# Patient Record
Sex: Female | Born: 1985
Health system: Southern US, Community
[De-identification: ages and names within clinical notes are randomized; demographics above are authoritative.]

## PROBLEM LIST (undated history)

## (undated) DIAGNOSIS — E079 Disorder of thyroid, unspecified: Secondary | ICD-10-CM

## (undated) DIAGNOSIS — F419 Anxiety disorder, unspecified: Secondary | ICD-10-CM

## (undated) DIAGNOSIS — T7840XA Allergy, unspecified, initial encounter: Secondary | ICD-10-CM

## (undated) HISTORY — DX: Allergy, unspecified, initial encounter: T78.40XA

## (undated) HISTORY — DX: Anxiety disorder, unspecified: F41.9

## (undated) HISTORY — PX: TUBAL LIGATION: SHX77

---

## 2013-05-14 ENCOUNTER — Emergency Department: Payer: Self-pay | Admitting: Emergency Medicine

## 2013-11-12 ENCOUNTER — Ambulatory Visit: Payer: Self-pay | Admitting: Physician Assistant

## 2013-11-29 DIAGNOSIS — M549 Dorsalgia, unspecified: Secondary | ICD-10-CM | POA: Insufficient documentation

## 2014-12-26 DIAGNOSIS — R5383 Other fatigue: Secondary | ICD-10-CM | POA: Insufficient documentation

## 2014-12-26 DIAGNOSIS — R519 Headache, unspecified: Secondary | ICD-10-CM | POA: Insufficient documentation

## 2015-03-13 DIAGNOSIS — E0590 Thyrotoxicosis, unspecified without thyrotoxic crisis or storm: Secondary | ICD-10-CM | POA: Insufficient documentation

## 2015-03-13 DIAGNOSIS — E559 Vitamin D deficiency, unspecified: Secondary | ICD-10-CM | POA: Insufficient documentation

## 2015-12-09 ENCOUNTER — Ambulatory Visit
Admission: EM | Admit: 2015-12-09 | Discharge: 2015-12-09 | Disposition: A | Discharge status: Home / Self Care | Payer: Self-pay | Attending: Family Medicine | Admitting: Family Medicine

## 2015-12-09 ENCOUNTER — Encounter: Payer: Self-pay | Admitting: *Deleted

## 2015-12-09 DIAGNOSIS — N390 Urinary tract infection, site not specified: Secondary | ICD-10-CM

## 2015-12-09 HISTORY — DX: Disorder of thyroid, unspecified: E07.9

## 2015-12-09 LAB — URINALYSIS COMPLETE WITH MICROSCOPIC (ARMC ONLY)
Bilirubin Urine: NEGATIVE
GLUCOSE, UA: NEGATIVE mg/dL
KETONES UR: NEGATIVE mg/dL
NITRITE: NEGATIVE
Protein, ur: NEGATIVE mg/dL
pH: 6 (ref 5.0–8.0)

## 2015-12-09 MED ORDER — CIPROFLOXACIN HCL 500 MG PO TABS: 500.0000 mg | ORAL_TABLET | Freq: Two times a day (BID) | ORAL | Status: DC

## 2015-12-09 NOTE — ED Provider Notes (Signed)
CSN: [id number]     Arrival date & time 12/09/15  1326 History   First MD Initiated Contact with Patient 12/09/15 1453     Chief Complaint  Patient presents with  . Urinary Tract Infection   (Consider location/radiation/quality/duration/timing/severity/associated sxs/prior Treatment) Patient is a 30 y.o. female presenting with dysuria. The history is provided by the patient.  Dysuria Pain quality:  Burning Pain severity:  Mild Onset quality:  Sudden Duration:  5 days Timing:  Constant Progression:  Worsening Chronicity:  New Recent urinary tract infections: no   Relieved by:  None tried Urinary symptoms: frequent urination   Urinary symptoms: no discolored urine, no foul-smelling urine, no hematuria, no hesitancy and no bladder incontinence   Associated symptoms: no abdominal pain, no fever, no flank pain, no genital lesions, no nausea, no vaginal discharge and no vomiting   Risk factors: no hx of pyelonephritis, no hx of urolithiasis, no kidney transplant, not pregnant, no recurrent urinary tract infections, no renal cysts, no renal disease, not single kidney and no urinary catheter     Past Medical History  Diagnosis Date  . Thyroid disease    Past Surgical History  Procedure Laterality Date  . Tubal ligation     History reviewed. No pertinent family history. Social History  Substance Use Topics  . Smoking status: Never Smoker   . Smokeless tobacco: Never Used  . Alcohol Use: Yes   OB History    No data available     Review of Systems  Constitutional: Negative for fever.  Gastrointestinal: Negative for nausea, vomiting and abdominal pain.  Genitourinary: Positive for dysuria. Negative for flank pain and vaginal discharge.    Allergies  Review of patient's allergies indicates no known allergies.  Home Medications   Prior to Admission medications   Medication Sig Start Date End Date Taking? Authorizing Provider  ciprofloxacin (CIPRO) 500 MG tablet Take 1 tablet  (500 mg total) by mouth every 12 (twelve) hours. 12/09/15   Payton Mccallum, MD   Meds Ordered and Administered this Visit  Medications - No data to display  BP 109/68 mmHg  Pulse 79  Temp(Src) 97.9 F (36.6 C) (Oral)  Resp 18  Ht  (1.6 m)  Wt 174 lb (78.926 kg)  BMI 30.83 kg/m2  SpO2 100%  LMP 11/21/2015 No data found.   Physical Exam  Constitutional: She appears well-developed and well-nourished. No distress.  Abdominal: Soft. Bowel sounds are normal. She exhibits no distension and no mass. There is tenderness (suprapubic;). There is no rebound and no guarding.  Skin: She is not diaphoretic.  Nursing note and vitals reviewed.   ED Course  Procedures (including critical care time)  Labs Review Labs Reviewed  URINALYSIS COMPLETEWITH MICROSCOPIC ([facility] ONLY) - Abnormal; Notable for the following:    Specific Gravity, Urine >1.030 (*)    Hgb urine dipstick TRACE (*)    Leukocytes, UA TRACE (*)    Bacteria, UA RARE (*)    Squamous Epithelial / LPF 6-30 (*)    All other components within normal limits  URINE CULTURE    Imaging Review No results found.   Visual Acuity Review  Right Eye Distance:   Left Eye Distance:   Bilateral Distance:    Right Eye Near:   Left Eye Near:    Bilateral Near:         MDM   1. UTI (lower urinary tract infection)     Discharge Medication List as of 12/09/2015  3:06 PM  START taking these medications   Details  ciprofloxacin (CIPRO) 500 MG tablet Take 1 tablet (500 mg total) by mouth every 12 (twelve) hours., Starting 12/09/2015, Until Discontinued, Normal       1. Lab results and diagnosis reviewed with patient 2. rx as per orders above; reviewed possible side effects, interactions, risks and benefits  3. Recommend supportive treatment with increased water intake, otc analgesics 4. Follow-up prn if symptoms worsen or don't improve    Payton Mccallum, MD 12/09/15 1507

## 2015-12-09 NOTE — ED Notes (Signed)
Patient started having symptoms of pelvic pain and burning on urination 1 week ago. Additional symptom of back pain started 3 days ago. Patient has tried drinking lots of water and juices with no resolution of symptoms.

## 2015-12-11 LAB — URINE CULTURE

## 2015-12-12 ENCOUNTER — Telehealth: Payer: Self-pay | Admitting: Emergency Medicine

## 2015-12-12 NOTE — ED Notes (Signed)
Patient was notified of her urine culture result and was instructed to finish her antibiotic.  Patient states that she is still having some discomfort.  Patient was instructed to follow-up here or with her PCP for further evaluation and for possible recollection of her urine.  Patient verbalized understanding.

## 2016-02-10 ENCOUNTER — Emergency Department
Admission: EM | Admit: 2016-02-10 | Discharge: 2016-02-10 | Disposition: A | Discharge status: Home / Self Care | Payer: Self-pay | Attending: Emergency Medicine | Admitting: Emergency Medicine

## 2016-02-10 DIAGNOSIS — E079 Disorder of thyroid, unspecified: Secondary | ICD-10-CM | POA: Insufficient documentation

## 2016-02-10 DIAGNOSIS — H6983 Other specified disorders of Eustachian tube, bilateral: Secondary | ICD-10-CM | POA: Insufficient documentation

## 2016-02-10 DIAGNOSIS — J0110 Acute frontal sinusitis, unspecified: Secondary | ICD-10-CM | POA: Insufficient documentation

## 2016-02-10 MED ORDER — CETIRIZINE HCL 10 MG PO TABS: 10.0000 mg | ORAL_TABLET | Freq: Every day | ORAL | Status: DC

## 2016-02-10 MED ORDER — FLUTICASONE PROPIONATE 50 MCG/ACT NA SUSP: 1.0000 | Freq: Two times a day (BID) | NASAL | Status: DC

## 2016-02-10 MED ORDER — AMOXICILLIN-POT CLAVULANATE 875-125 MG PO TABS: 1.0000 | ORAL_TABLET | Freq: Two times a day (BID) | ORAL | Status: DC

## 2016-02-10 NOTE — Discharge Instructions (Signed)

## 2016-02-10 NOTE — ED Provider Notes (Signed)
[facility] Emergency Department Provider Note  ____________________________________________  Time seen: Approximately 5:30 PM  I have reviewed the triage vital signs and the nursing notes.   HISTORY  Chief Complaint Otalgia    HPI Alexandria Jackson is a 30 y.o. female who presents emergency department complaining of sinus pressure and right ear pain. Patient states that she was recently flying and developed nasal congestion and ultimately ear pain status post falling. Patient denies any fevers, headaches, visual acuity changes, difficulty breathing or swallowing, chest pain, abdominal pain, nausea or vomiting.   Past Medical History  Diagnosis Date  . Thyroid disease     There are no active problems to display for this patient.   Past Surgical History  Procedure Laterality Date  . Tubal ligation      Current Outpatient Rx  Name  Route  Sig  Dispense  Refill  . amoxicillin-clavulanate (AUGMENTIN) 875-125 MG tablet   Oral   Take 1 tablet by mouth 2 (two) times daily.   14 tablet   0   . cetirizine (ZYRTEC) 10 MG tablet   Oral   Take 1 tablet (10 mg total) by mouth daily.   30 tablet   0   . ciprofloxacin (CIPRO) 500 MG tablet   Oral   Take 1 tablet (500 mg total) by mouth every 12 (twelve) hours.   10 tablet   0   . fluticasone (FLONASE) 50 MCG/ACT nasal spray   Each Nare   Place 1 spray into both nostrils 2 (two) times daily.   16 g   0     Allergies Review of patient's allergies indicates no known allergies.  No family history on file.  Social History Social History  Substance Use Topics  . Smoking status: Never Smoker   . Smokeless tobacco: Never Used  . Alcohol Use: Yes     Review of Systems  Constitutional: No fever/chills Eyes: No visual changes. No discharge ENT: No sore throat.For nasal congestion and sinus pressure. Positive for right ear pain. Cardiovascular: no chest pain. Respiratory: no cough. No  SOB. Skin: Negative for rash. Neurological: Negative for headaches, focal weakness or numbness. 10-point ROS otherwise negative.  ____________________________________________   PHYSICAL EXAM:  VITAL SIGNS: ED Triage Vitals  Enc Vitals Group     BP 02/10/16 1713 130/81 mmHg     Pulse Rate 02/10/16 1713 96     Resp 02/10/16 1713 18     Temp 02/10/16 1713 98.6 F (37 C)     Temp Source 02/10/16 1713 Oral     SpO2 02/10/16 1713 98 %     Weight 02/10/16 1713 168 lb (76.204 kg)     Height 02/10/16 1713  (1.6 m)     Head Cir --      Peak Flow --      Pain Score 02/10/16 1715 9     Pain Loc --      Pain Edu? --      Excl. in GC? --      Constitutional: Alert and oriented. Well appearing and in no acute distress. Eyes: Conjunctivae are normal. PERRL. EOMI. Head: Atraumatic. ENT:      Ears: EACs are unremarkable bilaterally. TMs are moderately bulging or air-fluid level.      Nose: Mild purulent congestion/rhinnorhea. Turbinates are boggy. Patient is tender to percussion over the frontal and maxillary sinuses.      Mouth/Throat: Mucous membranes are moist.  Neck: No stridor.  Hematological/Lymphatic/Immunilogical: No cervical lymphadenopathy. Cardiovascular: Normal rate, regular rhythm. Normal S1 and S2.  Good peripheral circulation. Respiratory: Normal respiratory effort without tachypnea or retractions. Lungs CTAB. Neurologic:  Normal speech and language. No gross focal neurologic deficits are appreciated.  Skin:  Skin is warm, dry and intact. No rash noted. Psychiatric: Mood and affect are normal. Speech and behavior are normal. Patient exhibits appropriate insight and judgement.   ____________________________________________   LABS (all labs ordered are listed, but only abnormal results are displayed)  Labs Reviewed - No data to display ____________________________________________  EKG   ____________________________________________  RADIOLOGY   No  results found.  ____________________________________________    PROCEDURES  Procedure(s) performed:       Medications - No data to display   ____________________________________________   INITIAL IMPRESSION / ASSESSMENT AND PLAN / ED COURSE  Pertinent labs & imaging results that were available during my care of the patient were reviewed by me and considered in my medical decision making (see chart for details).  Patient's diagnosis is consistent with acute sinusitis resulting in eustachian tube dysfunction.. Patient will be discharged home with prescriptions for Flonase, Zyrtec, antibiotics. Patient is to follow up with primary care provider if symptoms persist past this treatment course. Patient is given ED precautions to return to the ED for any worsening or new symptoms.     ____________________________________________  FINAL CLINICAL IMPRESSION(S) / ED DIAGNOSES  Final diagnoses:  Acute frontal sinusitis, recurrence not specified  Eustachian tube dysfunction, bilateral      NEW MEDICATIONS STARTED DURING THIS VISIT:  New Prescriptions   AMOXICILLIN-CLAVULANATE (AUGMENTIN) 875-125 MG TABLET    Take 1 tablet by mouth 2 (two) times daily.   CETIRIZINE (ZYRTEC) 10 MG TABLET    Take 1 tablet (10 mg total) by mouth daily.   FLUTICASONE (FLONASE) 50 MCG/ACT NASAL SPRAY    Place 1 spray into both nostrils 2 (two) times daily.        This chart was dictated using voice recognition software/Dragon. Despite best efforts to proofread, errors can occur which can change the meaning. Any change was purely unintentional.    Racheal PatchesJonathan D Destaney Sarkis, PA-C 02/10/16 1756  Phineas SemenGraydon Goodman, MD 02/10/16 1810

## 2016-02-10 NOTE — ED Notes (Signed)
Developed right ear pain this am   No fever or drainage from ear  Positive sinus pressure

## 2016-02-10 NOTE — ED Notes (Signed)
Pt c/o right ear pain today, states she has had some sinus congestion

## 2016-02-21 ENCOUNTER — Emergency Department
Admission: EM | Admit: 2016-02-21 | Discharge: 2016-02-21 | Disposition: A | Discharge status: Home / Self Care | Payer: BLUE CROSS/BLUE SHIELD | Attending: Emergency Medicine | Admitting: Emergency Medicine

## 2016-02-21 ENCOUNTER — Encounter: Payer: Self-pay | Admitting: Emergency Medicine

## 2016-02-21 DIAGNOSIS — Z7951 Long term (current) use of inhaled steroids: Secondary | ICD-10-CM | POA: Insufficient documentation

## 2016-02-21 DIAGNOSIS — R102 Pelvic and perineal pain: Secondary | ICD-10-CM | POA: Diagnosis present

## 2016-02-21 DIAGNOSIS — E079 Disorder of thyroid, unspecified: Secondary | ICD-10-CM | POA: Diagnosis not present

## 2016-02-21 DIAGNOSIS — Z792 Long term (current) use of antibiotics: Secondary | ICD-10-CM | POA: Insufficient documentation

## 2016-02-21 DIAGNOSIS — N3090 Cystitis, unspecified without hematuria: Secondary | ICD-10-CM | POA: Insufficient documentation

## 2016-02-21 LAB — URINALYSIS COMPLETE WITH MICROSCOPIC (ARMC ONLY)
BILIRUBIN URINE: NEGATIVE
GLUCOSE, UA: NEGATIVE mg/dL
HGB URINE DIPSTICK: NEGATIVE
KETONES UR: NEGATIVE mg/dL
Nitrite: NEGATIVE
PH: 7 (ref 5.0–8.0)
Protein, ur: NEGATIVE mg/dL
Specific Gravity, Urine: 1.024 (ref 1.005–1.030)

## 2016-02-21 LAB — POCT PREGNANCY, URINE: Preg Test, Ur: NEGATIVE

## 2016-02-21 MED ORDER — NITROFURANTOIN MACROCRYSTAL 100 MG PO CAPS: 100.0000 mg | ORAL_CAPSULE | Freq: Two times a day (BID) | ORAL | Status: DC

## 2016-02-21 MED ORDER — NAPROXEN 500 MG PO TABS: 500.0000 mg | ORAL_TABLET | Freq: Two times a day (BID) | ORAL | Status: DC

## 2016-02-21 NOTE — ED Provider Notes (Signed)
[facility] Emergency Department Provider Note  ____________________________________________  Time seen: 4:00 PM  I have reviewed the triage vital signs and the nursing notes.   HISTORY  Chief Complaint Abdominal Cramping    HPI Alexandria Jackson is a 30 y.o. female who complains of intermittent abdominal cramping over the past week associated with abdominal bloating and breast tenderness bilaterally. She took a home pregnancy test which was negative. She has a history of tubal ligation 7 years ago. No vaginal bleeding or discharge. She has some urinary frequency but no dysuria or hematuria. Eating and drinking normally. She was recently treated for sinusitis with Augmentin but she reports she stopped the antibiotic course early before completing it.    Past Medical History  Diagnosis Date  . Thyroid disease      There are no active problems to display for this patient.    Past Surgical History  Procedure Laterality Date  . Tubal ligation       Current Outpatient Rx  Name  Route  Sig  Dispense  Refill  . amoxicillin-clavulanate (AUGMENTIN) 875-125 MG tablet   Oral   Take 1 tablet by mouth 2 (two) times daily.   14 tablet   0   . cetirizine (ZYRTEC) 10 MG tablet   Oral   Take 1 tablet (10 mg total) by mouth daily.   30 tablet   0   . ciprofloxacin (CIPRO) 500 MG tablet   Oral   Take 1 tablet (500 mg total) by mouth every 12 (twelve) hours.   10 tablet   0   . fluticasone (FLONASE) 50 MCG/ACT nasal spray   Each Nare   Place 1 spray into both nostrils 2 (two) times daily.   16 g   0   . naproxen (NAPROSYN) 500 MG tablet   Oral   Take 1 tablet (500 mg total) by mouth 2 (two) times daily with a meal.   20 tablet   0   . nitrofurantoin (MACRODANTIN) 100 MG capsule   Oral   Take 1 capsule (100 mg total) by mouth 2 (two) times daily.   6 capsule   0      Allergies Review of patient's allergies indicates no known  allergies.   No family history on file.  Social History Social History  Substance Use Topics  . Smoking status: Never Smoker   . Smokeless tobacco: Never Used  . Alcohol Use: Yes    Review of Systems  Constitutional:   No fever or chills.  Eyes:   No vision changes.  ENT:   No sore throat. No rhinorrhea. Cardiovascular:   No chest pain. Respiratory:   No dyspnea or cough. Gastrointestinal:   Positive lower abdominal pain with bloating but without vomiting and diarrhea.  No bloody stool. Genitourinary:   Positive urinary frequency. Musculoskeletal:   Negative for focal pain or swelling Neurological:   Negative for headaches 10-point ROS otherwise negative.  ____________________________________________   PHYSICAL EXAM:  VITAL SIGNS: ED Triage Vitals  Enc Vitals Group     BP 02/21/16 1454 140/88 mmHg     Pulse Rate 02/21/16 1454 90     Resp 02/21/16 1454 18     Temp 02/21/16 1454 99 F (37.2 C)     Temp src --      SpO2 02/21/16 1454 96 %     Weight 02/21/16 1454 168 lb (76.204 kg)     Height 02/21/16 1454  (1.6 m)  Head Cir --      Peak Flow --      Pain Score 02/21/16 1456 0     Pain Loc --      Pain Edu? --      Excl. in GC? --     Vital signs reviewed, nursing assessments reviewed.   Constitutional:   Alert and oriented. Well appearing and in no distress. Eyes:   No scleral icterus. No conjunctival pallor. PERRL. EOMI ENT   Head:   Normocephalic and atraumatic.   Nose:   No congestion/rhinnorhea. No septal hematoma   Mouth/Throat:   MMM, no pharyngeal erythema. No peritonsillar mass.    Neck:   No stridor. No SubQ emphysema. No meningismus. Hematological/Lymphatic/Immunilogical:   No cervical lymphadenopathy. Cardiovascular:   RRR. Symmetric bilateral radial and DP pulses.  No murmurs.  Respiratory:   Normal respiratory effort without tachypnea nor retractions. Breath sounds are clear and equal bilaterally. No  wheezes/rales/rhonchi. Gastrointestinal:   Soft with mild suprapubic tenderness. Non distended. There is no CVA tenderness.  No rebound, rigidity, or guarding. Genitourinary:   deferred Musculoskeletal:   Nontender with normal range of motion in all extremities. No joint effusions.  No lower extremity tenderness.  No edema. Neurologic:   Normal speech and language.  CN 2-10 normal. Motor grossly intact. No gross focal neurologic deficits are appreciated.  Skin:    Skin is warm, dry and intact. No rash noted.  No petechiae, purpura, or bullae.  ____________________________________________    LABS (pertinent positives/negatives) (all labs ordered are listed, but only abnormal results are displayed) Labs Reviewed  URINALYSIS COMPLETEWITH MICROSCOPIC ([facility] ONLY) - Abnormal; Notable for the following:    Color, Urine YELLOW (*)    APPearance HAZY (*)    Leukocytes, UA 1+ (*)    Bacteria, UA RARE (*)    Squamous Epithelial / LPF 6-30 (*)    All other components within normal limits  POC URINE PREG, ED  POCT PREGNANCY, URINE   ____________________________________________   EKG    ____________________________________________    RADIOLOGY    ____________________________________________   PROCEDURES   ____________________________________________   INITIAL IMPRESSION / ASSESSMENT AND PLAN / ED COURSE  Pertinent labs & imaging results that were available during my care of the patient were reviewed by me and considered in my medical decision making (see chart for details).  Patient presents with suprapubic pain and tenderness, urinary frequency, consistent with cystitis. She is also having an irregular period as it's been about 6 weeks since her last menstrual cycle. Pregnancy test is negative. She is well-appearing no acute distress, energetic and smiling pleasantly interactive. Counseled to take NSAIDs for now, Macrobid for cystitis, follow-up with primary care. Possibly  has an ovarian cyst but her presentation is not consistent with torsion at this time. Low Suspicion for STI or PID.     ____________________________________________   FINAL CLINICAL IMPRESSION(S) / ED DIAGNOSES  Final diagnoses:  Pelvic pain in female  Cystitis       Portions of this note were generated with dragon dictation software. Dictation errors may occur despite best attempts at proofreading.   Sharman CheekPhillip Lanorris Kalisz, MD 02/21/16 [phone]

## 2016-02-21 NOTE — ED Notes (Signed)
LMP 01/06/16.  Patient has been having lower abdominal cramping and breast tenderness, has not had menstruation this month.  Patient had tubal ligation 7 years ago.  Home pregnancy test negative.

## 2016-02-21 NOTE — ED Notes (Signed)
Lower abdominal pain intermittently. Bilateral breast tenderness intermittently. Pt no menstrual cycle this month Pt alert and oriented X4, active, cooperative, pt in NAD. RR even and unlabored, color WNL.

## 2016-02-21 NOTE — ED Notes (Signed)
Pt alert and oriented X4, active, cooperative, pt in NAD. RR even and unlabored, color WNL.  Pt informed to return if any life threatening symptoms occur.   

## 2016-02-21 NOTE — Discharge Instructions (Signed)
Abdominal Pain, Adult °Many things can cause abdominal pain. Usually, abdominal pain is not caused by a disease and will improve without treatment. It can often be observed and treated at home. Your health care provider will do a physical exam and possibly order blood tests and X-rays to help determine the seriousness of your pain. However, in many cases, more time must pass before a clear cause of the pain can be found. Before that point, your health care provider may not know if you need more testing or further treatment. °HOME CARE INSTRUCTIONS °Monitor your abdominal pain for any changes. The following actions may help to alleviate any discomfort you are experiencing: °· Only take over-the-counter or prescription medicines as directed by your health care provider. °· Do not take laxatives unless directed to do so by your health care provider. °· Try a clear liquid diet (broth, tea, or water) as directed by your health care provider. Slowly move to a bland diet as tolerated. °SEEK MEDICAL CARE IF: °· You have unexplained abdominal pain. °· You have abdominal pain associated with nausea or diarrhea. °· You have pain when you urinate or have a bowel movement. °· You experience abdominal pain that wakes you in the night. °· You have abdominal pain that is worsened or improved by eating food. °· You have abdominal pain that is worsened with eating fatty foods. °· You have a fever. °SEEK IMMEDIATE MEDICAL CARE IF: °· Your pain does not go away within 2 hours. °· You keep throwing up (vomiting). °· Your pain is felt only in portions of the abdomen, such as the right side or the left lower portion of the abdomen. °· You pass bloody or black tarry stools. °MAKE SURE YOU: °· Understand these instructions. °· Will watch your condition. °· Will get help right away if you are not doing well or get worse. °  °This information is not intended to replace advice given to you by your health care provider. Make sure you discuss  any questions you have with your health care provider. °  °Document Released: 07/29/2005 Document Revised: 07/10/2015 Document Reviewed: 06/28/2013 °Elsevier Interactive Patient Education ©2016 Elsevier Inc. ° °Pelvic Pain, Female °Female pelvic pain can be caused by many different things and start from a variety of places. Pelvic pain refers to pain that is located in the lower half of the abdomen and between your hips. The pain may occur over a short period of time (acute) or may be reoccurring (chronic). The cause of pelvic pain may be related to disorders affecting the female reproductive organs (gynecologic), but it may also be related to the bladder, kidney stones, an intestinal complication, or muscle or skeletal problems. Getting help right away for pelvic pain is important, especially if there has been severe, sharp, or a sudden onset of unusual pain. It is also important to get help right away because some types of pelvic pain can be life threatening.  °CAUSES  °Below are only some of the causes of pelvic pain. The causes of pelvic pain can be in one of several categories.  °· Gynecologic. °· Pelvic inflammatory disease. °· Sexually transmitted infection. °· Ovarian cyst or a twisted ovarian ligament (ovarian torsion). °· Uterine lining that grows outside the uterus (endometriosis). °· Fibroids, cysts, or tumors. °· Ovulation. °· Pregnancy. °· Pregnancy that occurs outside the uterus (ectopic pregnancy). °· Miscarriage. °· Labor. °· Abruption of the placenta or ruptured uterus. °· Infection. °· Uterine infection (endometritis). °· Bladder infection. °· Diverticulitis. °·   Miscarriage related to a uterine infection (septic abortion). °· Bladder. °· Inflammation of the bladder (cystitis). °· Kidney stone(s). °· Gastrointestinal. °· Constipation. °· Diverticulitis. °· Neurologic. °· Trauma. °· Feeling pelvic pain because of mental or emotional causes (psychosomatic). °· Cancers of the bowel or  pelvis. °EVALUATION  °Your caregiver will want to take a careful history of your concerns. This includes recent changes in your health, a careful gynecologic history of your periods (menses), and a sexual history. Obtaining your family history and medical history is also important. Your caregiver may suggest a pelvic exam. A pelvic exam will help identify the location and severity of the pain. It also helps in the evaluation of which organ system may be involved. In order to identify the cause of the pelvic pain and be properly treated, your caregiver may order tests. These tests may include:  °· A pregnancy test. °· Pelvic ultrasonography. °· An X-ray exam of the abdomen. °· A urinalysis or evaluation of vaginal discharge. °· Blood tests. °HOME CARE INSTRUCTIONS  °· Only take over-the-counter or prescription medicines for pain, discomfort, or fever as directed by your caregiver.   °· Rest as directed by your caregiver.   °· Eat a balanced diet.   °· Drink enough fluids to make your urine clear or pale yellow, or as directed.   °· Avoid sexual intercourse if it causes pain.   °· Apply warm or cold compresses to the lower abdomen depending on which one helps the pain.   °· Avoid stressful situations.   °· Keep a journal of your pelvic pain. Write down when it started, where the pain is located, and if there are things that seem to be associated with the pain, such as food or your menstrual cycle. °· Follow up with your caregiver as directed.   °SEEK MEDICAL CARE IF: °· Your medicine does not help your pain. °· You have abnormal vaginal discharge. °SEEK IMMEDIATE MEDICAL CARE IF:  °· You have heavy bleeding from the vagina.   °· Your pelvic pain increases.   °· You feel light-headed or faint.   °· You have chills.   °· You have pain with urination or blood in your urine.   °· You have uncontrolled diarrhea or vomiting.   °· You have a fever or persistent symptoms for more than 3 days. °· You have a fever and your  symptoms suddenly get worse.   °· You are being physically or sexually abused. °  °This information is not intended to replace advice given to you by your health care provider. Make sure you discuss any questions you have with your health care provider. °  °Document Released: 09/15/2004 Document Revised: 07/10/2015 Document Reviewed: 02/08/2012 °Elsevier Interactive Patient Education ©2016 Elsevier Inc. ° °Urinary Tract Infection °Urinary tract infections (UTIs) can develop anywhere along your urinary tract. Your urinary tract is your body's drainage system for removing wastes and extra water. Your urinary tract includes two kidneys, two ureters, a bladder, and a urethra. Your kidneys are a pair of bean-shaped organs. Each kidney is about the size of your fist. They are located below your ribs, one on each side of your spine. °CAUSES °Infections are caused by microbes, which are microscopic organisms, including fungi, viruses, and bacteria. These organisms are so small that they can only be seen through a microscope. Bacteria are the microbes that most commonly cause UTIs. °SYMPTOMS  °Symptoms of UTIs may vary by age and gender of the patient and by the location of the infection. Symptoms in young women typically include a frequent and intense urge   to urinate and a painful, burning feeling in the bladder or urethra during urination. Older women and men are more likely to be tired, shaky, and weak and have muscle aches and abdominal pain. A fever may mean the infection is in your kidneys. Other symptoms of a kidney infection include pain in your back or sides below the ribs, nausea, and vomiting. °DIAGNOSIS °To diagnose a UTI, your caregiver will ask you about your symptoms. Your caregiver will also ask you to provide a urine sample. The urine sample will be tested for bacteria and white blood cells. White blood cells are made by your body to help fight infection. °TREATMENT  °Typically, UTIs can be treated with  medication. Because most UTIs are caused by a bacterial infection, they usually can be treated with the use of antibiotics. The choice of antibiotic and length of treatment depend on your symptoms and the type of bacteria causing your infection. °HOME CARE INSTRUCTIONS °· If you were prescribed antibiotics, take them exactly as your caregiver instructs you. Finish the medication even if you feel better after you have only taken some of the medication. °· Drink enough water and fluids to keep your urine clear or pale yellow. °· Avoid caffeine, tea, and carbonated beverages. They tend to irritate your bladder. °· Empty your bladder often. Avoid holding urine for long periods of time. °· Empty your bladder before and after sexual intercourse. °· After a bowel movement, women should cleanse from front to back. Use each tissue only once. °SEEK MEDICAL CARE IF:  °· You have back pain. °· You develop a fever. °· Your symptoms do not begin to resolve within 3 days. °SEEK IMMEDIATE MEDICAL CARE IF:  °· You have severe back pain or lower abdominal pain. °· You develop chills. °· You have nausea or vomiting. °· You have continued burning or discomfort with urination. °MAKE SURE YOU:  °· Understand these instructions. °· Will watch your condition. °· Will get help right away if you are not doing well or get worse. °  °This information is not intended to replace advice given to you by your health care provider. Make sure you discuss any questions you have with your health care provider. °  °Document Released: 07/29/2005 Document Revised: 07/10/2015 Document Reviewed: 11/27/2011 °Elsevier Interactive Patient Education ©2016 Elsevier Inc. ° °

## 2016-04-22 ENCOUNTER — Encounter: Payer: Self-pay | Admitting: Emergency Medicine

## 2016-04-22 ENCOUNTER — Emergency Department
Admission: EM | Admit: 2016-04-22 | Discharge: 2016-04-22 | Disposition: A | Discharge status: Home / Self Care | Payer: BLUE CROSS/BLUE SHIELD | Attending: Emergency Medicine | Admitting: Emergency Medicine

## 2016-04-22 DIAGNOSIS — M273 Alveolitis of jaws: Secondary | ICD-10-CM | POA: Diagnosis not present

## 2016-04-22 DIAGNOSIS — K0889 Other specified disorders of teeth and supporting structures: Secondary | ICD-10-CM | POA: Diagnosis present

## 2016-04-22 MED ORDER — LIDOCAINE VISCOUS 2 % MT SOLN
15.0000 mL | Freq: Once | OROMUCOSAL | Status: AC
  Administered 2016-04-22: 15 mL via OROMUCOSAL
  Filled 2016-04-22: qty 15

## 2016-04-22 MED ORDER — AMOXICILLIN 500 MG PO CAPS: 500.0000 mg | ORAL_CAPSULE | Freq: Three times a day (TID) | ORAL | Status: DC

## 2016-04-22 MED ORDER — LIDOCAINE VISCOUS 2 % MT SOLN: 15.0000 mL | Freq: Once | OROMUCOSAL | Status: DC

## 2016-04-22 MED ORDER — LIDOCAINE VISCOUS 2 % MT SOLN: 5.0000 mL | Freq: Four times a day (QID) | OROMUCOSAL | Status: DC | PRN

## 2016-04-22 MED ORDER — TRAMADOL HCL 50 MG PO TABS: 50.0000 mg | ORAL_TABLET | Freq: Four times a day (QID) | ORAL | Status: AC | PRN

## 2016-04-22 NOTE — Discharge Instructions (Signed)
Dental Dry Socket A dental dry socket can happen after a tooth is pulled. When a tooth gets pulled, it leaves a hole (socket). Normally, blood fills up the hole and hardens (clots). A dry socket happens if blood gets removed from the hole or does not fill the hole.  HOME CARE  Follow your dentist's instructions.  Only take medicines as told by your dentist.  Take your medicine (antibiotics) as told if you are given medicine. Finish them even if you start to feel better.  Wait 1 day to rinse your mouth with warm salt water. Spit water out very gently.  Avoid bubbly (carbonated) drinks.  Avoid alcohol.  Avoid smoking. GET HELP RIGHT AWAY IF:  Your medicine does not help your pain.  You have puffiness (swelling), severe pain, or you cannot stop bleeding.  You have a temperature by mouth above 102 F (38.9 C), not controlled by medicine.  You have trouble swallowing or cannot open your mouth.  You have severe problems (symptoms). MAKE SURE YOU:  Understand these instructions.  Will watch your condition.  Will get help right away if you are not doing well or get worse.   This information is not intended to replace advice given to you by your health care provider. Make sure you discuss any questions you have with your health care provider.   Document Released: 10/19/2005 Document Revised: 11/09/2014 Document Reviewed: 06/03/2015 Elsevier Interactive Patient Education [employer]2016 Elsevier Inc.

## 2016-04-22 NOTE — ED Notes (Signed)
NAD noted at time of D/C. Pt denies questions or concerns. Pt ambulatory to the lobby at this time.  

## 2016-04-22 NOTE — ED Provider Notes (Signed)
[facility] Emergency Department Provider Note   ____________________________________________  Time seen: Approximately 6:31 PM  I have reviewed the triage vital signs and the nursing notes.   HISTORY  Chief Complaint Dental Pain    HPI Alexandria Jackson is a 30 y.o. female patient complaining of dental pain left lower molar. Patient states she had 4 wisdom teeth pulled out a week ago. Patient right side is doing fine but the bottom left is painful and she believes infected. Patient states she is contacted a dentist but cannot see her until the 26th of this month. Patient states she's taking ibuprofen with no relief. She states she is able to tolerate food and fluids. Patient denies any fever with this complaint. Patient state the gingiva area is edematous. Patient is rating the pain as 8/10. No other palliative measures for this complaint.   Past Medical History  Diagnosis Date  . Thyroid disease     There are no active problems to display for this patient.   Past Surgical History  Procedure Laterality Date  . Tubal ligation      Current Outpatient Rx  Name  Route  Sig  Dispense  Refill  . amoxicillin (AMOXIL) 500 MG capsule   Oral   Take 1 capsule (500 mg total) by mouth 3 (three) times daily.   30 capsule   0   . amoxicillin-clavulanate (AUGMENTIN) 875-125 MG tablet   Oral   Take 1 tablet by mouth 2 (two) times daily.   14 tablet   0   . cetirizine (ZYRTEC) 10 MG tablet   Oral   Take 1 tablet (10 mg total) by mouth daily.   30 tablet   0   . ciprofloxacin (CIPRO) 500 MG tablet   Oral   Take 1 tablet (500 mg total) by mouth every 12 (twelve) hours.   10 tablet   0   . fluticasone (FLONASE) 50 MCG/ACT nasal spray   Each Nare   Place 1 spray into both nostrils 2 (two) times daily.   16 g   0   . lidocaine (XYLOCAINE) 2 % solution   Mouth/Throat   Use as directed 5 mLs in the mouth or throat every 6 (six) hours as needed for  mouth pain.   100 mL   0   . naproxen (NAPROSYN) 500 MG tablet   Oral   Take 1 tablet (500 mg total) by mouth 2 (two) times daily with a meal.   20 tablet   0   . nitrofurantoin (MACRODANTIN) 100 MG capsule   Oral   Take 1 capsule (100 mg total) by mouth 2 (two) times daily.   6 capsule   0   . traMADol (ULTRAM) 50 MG tablet   Oral   Take 1 tablet (50 mg total) by mouth every 6 (six) hours as needed.   20 tablet   0     Allergies Review of patient's allergies indicates no known allergies.  No family history on file.  Social History Social History  Substance Use Topics  . Smoking status: Never Smoker   . Smokeless tobacco: Never Used  . Alcohol Use: Yes    Review of Systems Constitutional: No fever/chills Eyes: No visual changes. ENT: No sore throat.Dental pain Cardiovascular: Denies chest pain. Respiratory: Denies shortness of breath. Gastrointestinal: No abdominal pain.  No nausea, no vomiting.  No diarrhea.  No constipation. Genitourinary: Negative for dysuria. Musculoskeletal: Negative for back pain. Skin: Negative for  rash. Neurological: Negative for headaches, focal weakness or numbness. Endocrine:Hypothyroidism ____________________________________________   PHYSICAL EXAM:  VITAL SIGNS: ED Triage Vitals  Enc Vitals Group     BP 04/22/16 1829 136/89 mmHg     Pulse Rate 04/22/16 1829 100     Resp 04/22/16 1829 18     Temp 04/22/16 1829 98.7 F (37.1 C)     Temp Source 04/22/16 1829 Oral     SpO2 04/22/16 1829 98 %     Weight 04/22/16 1829 181 lb (82.101 kg)     Height 04/22/16 1829 5\' 3"  (1.6 m)     Head Cir --      Peak Flow --      Pain Score 04/22/16 1826 8     Pain Loc --      Pain Edu? --      Excl. in GC? --     Constitutional: Alert and oriented. Well appearing and in no acute distress. Eyes: Conjunctivae are normal. PERRL. EOMI. Head: Atraumatic. Nose: No congestion/rhinnorhea. Mouth/Throat: Mucous membranes are moist.   Oropharynx non-erythematous.Edematous gingiva tooth #17. Neck: No stridor.  No cervical spine tenderness to palpation. Hematological/Lymphatic/Immunilogical: No cervical lymphadenopathy. Cardiovascular: Normal rate, regular rhythm. Grossly normal heart sounds.  Good peripheral circulation. Respiratory: Normal respiratory effort.  No retractions. Lungs CTAB. Gastrointestinal: Soft and nontender. No distention. No abdominal bruits. No CVA tenderness. Musculoskeletal: No lower extremity tenderness nor edema.  No joint effusions. Neurologic:  Normal speech and language. No gross focal neurologic deficits are appreciated. No gait instability. Skin:  Skin is warm, dry and intact. No rash noted. Psychiatric: Mood and affect are normal. Speech and behavior are normal.  ____________________________________________   LABS (all labs ordered are listed, but only abnormal results are displayed)  Labs Reviewed - No data to display ____________________________________________  EKG   ____________________________________________  RADIOLOGY   ____________________________________________   PROCEDURES  Procedure(s) performed: None  Critical Care performed: No  ____________________________________________   INITIAL IMPRESSION / ASSESSMENT AND PLAN / ED COURSE  Pertinent labs & imaging results that were available during my care of the patient were reviewed by me and considered in my medical decision making (see chart for details).  Dry socket tooth #17. Patient given discharge Instructions. Patient advised to follow-up with scheduled dental appointment next week. Patient given prescription for tramadol, viscous lidocaine, and Amoxil. ____________________________________________   FINAL CLINICAL IMPRESSION(S) / ED DIAGNOSES  Final diagnoses:  Dry tooth socket      NEW MEDICATIONS STARTED DURING THIS VISIT:  New Prescriptions   AMOXICILLIN (AMOXIL) 500 MG CAPSULE    Take 1 capsule  (500 mg total) by mouth 3 (three) times daily.   LIDOCAINE (XYLOCAINE) 2 % SOLUTION    Use as directed 5 mLs in the mouth or throat every 6 (six) hours as needed for mouth pain.   TRAMADOL (ULTRAM) 50 MG TABLET    Take 1 tablet (50 mg total) by mouth every 6 (six) hours as needed.     Note:  This document was prepared using Dragon voice recognition software and may include unintentional dictation errors.    Joni Reiningonald K Ruari Duggan, PA-C 04/22/16 1851  Phineas SemenGraydon Goodman, MD 04/22/16 [phone]

## 2016-04-22 NOTE — ED Notes (Signed)
Patient presents to the ED with dental pain to the left side of her mouth. Patient states her wisdom teeth were pulled about 1 week ago and the right side seems fine but the left side feels, "infected and painful".  Patient reports pain radiating into throat and face. Patient is in no obvious distress at this time.  Skin is warm and dry.  Patient is maintaining secretions and patient is speaking in full sentences.

## 2016-10-08 DIAGNOSIS — L700 Acne vulgaris: Secondary | ICD-10-CM | POA: Insufficient documentation

## 2016-10-08 DIAGNOSIS — F418 Other specified anxiety disorders: Secondary | ICD-10-CM | POA: Insufficient documentation

## 2017-04-03 ENCOUNTER — Encounter: Payer: Self-pay | Admitting: Medical Oncology

## 2017-04-03 ENCOUNTER — Emergency Department
Admission: EM | Admit: 2017-04-03 | Discharge: 2017-04-03 | Disposition: A | Discharge status: Home / Self Care | Payer: BLUE CROSS/BLUE SHIELD | Attending: Emergency Medicine | Admitting: Emergency Medicine

## 2017-04-03 DIAGNOSIS — H9201 Otalgia, right ear: Secondary | ICD-10-CM | POA: Diagnosis present

## 2017-04-03 DIAGNOSIS — J0100 Acute maxillary sinusitis, unspecified: Secondary | ICD-10-CM | POA: Diagnosis not present

## 2017-04-03 MED ORDER — BENZONATATE 100 MG PO CAPS: 200.0000 mg | ORAL_CAPSULE | Freq: Three times a day (TID) | ORAL | 0 refills | Status: AC | PRN

## 2017-04-03 MED ORDER — IBUPROFEN 600 MG PO TABS: 600.0000 mg | ORAL_TABLET | Freq: Three times a day (TID) | ORAL | 0 refills | Status: DC | PRN

## 2017-04-03 MED ORDER — AMOXICILLIN 500 MG PO CAPS: 500.0000 mg | ORAL_CAPSULE | Freq: Three times a day (TID) | ORAL | 0 refills | Status: DC

## 2017-04-03 MED ORDER — FEXOFENADINE-PSEUDOEPHED ER 60-120 MG PO TB12: 1.0000 | ORAL_TABLET | Freq: Two times a day (BID) | ORAL | 0 refills | Status: DC

## 2017-04-03 NOTE — ED Notes (Signed)
See triage note  States she developed right ear pain with some drainage   Afebrile at present

## 2017-04-03 NOTE — ED Triage Notes (Signed)
Rt ear pain with drainage that began Tuesday.

## 2017-04-03 NOTE — ED Provider Notes (Signed)
[facility] Emergency Department Provider Note   ____________________________________________   First MD Initiated Contact with Patient 04/03/17 1059     (approximate)  I have reviewed the triage vital signs and the nursing notes.   HISTORY  Chief Complaint Otalgia    HPI Alexandria Jackson is a 31 y.o. female patient complaining of right ear pain with drainage for 3 days. Patient also complaining of sinus congestion and postnasal drainage. Patient stated there is nonproductive cough. Patient denies fever or chills. Patient denies nausea, vomiting, or diarrhea. No palliative measures for complaint.patient rates pain as a 5/10. Patient described a pain as "achy/pressure".   Past Medical History:  Diagnosis Date  . Thyroid disease     There are no active problems to display for this patient.   Past Surgical History:  Procedure Laterality Date  . TUBAL LIGATION      Prior to Admission medications   Medication Sig Start Date End Date Taking? Authorizing Provider  amoxicillin (AMOXIL) 500 MG capsule Take 1 capsule (500 mg total) by mouth 3 (three) times daily. 04/22/16   Joni Reining, PA-C  amoxicillin-clavulanate (AUGMENTIN) 875-125 MG tablet Take 1 tablet by mouth 2 (two) times daily. 02/10/16   Cuthriell, Delorise Royals, PA-C  cetirizine (ZYRTEC) 10 MG tablet Take 1 tablet (10 mg total) by mouth daily. 02/10/16   Cuthriell, Delorise Royals, PA-C  ciprofloxacin (CIPRO) 500 MG tablet Take 1 tablet (500 mg total) by mouth every 12 (twelve) hours. 12/09/15   Payton Mccallum, MD  fluticasone (FLONASE) 50 MCG/ACT nasal spray Place 1 spray into both nostrils 2 (two) times daily. 02/10/16   Cuthriell, Delorise Royals, PA-C  lidocaine (XYLOCAINE) 2 % solution Use as directed 5 mLs in the mouth or throat every 6 (six) hours as needed for mouth pain. 04/22/16   Joni Reining, PA-C  naproxen (NAPROSYN) 500 MG tablet Take 1 tablet (500 mg total) by mouth 2 (two) times daily with a  meal. 02/21/16   Sharman Cheek, MD  nitrofurantoin (MACRODANTIN) 100 MG capsule Take 1 capsule (100 mg total) by mouth 2 (two) times daily. 02/21/16   Sharman Cheek, MD  traMADol (ULTRAM) 50 MG tablet Take 1 tablet (50 mg total) by mouth every 6 (six) hours as needed. 04/22/16 04/22/17  Joni Reining, PA-C    Allergies Patient has no known allergies.  No family history on file.  Social History Social History  Substance Use Topics  . Smoking status: Never Smoker  . Smokeless tobacco: Never Used  . Alcohol use Yes    Review of Systems Constitutional: No fever/chills Eyes: No visual changes. ENT: No sore throat. Ear pressure/pain. Nasal congestion. Postnasal drainage Cardiovascular: Denies chest pain. Respiratory: Denies shortness of breath. Nonproductive cough Gastrointestinal: No abdominal pain.  No nausea, no vomiting.  No diarrhea.  No constipation. Genitourinary: Negative for dysuria. Musculoskeletal: Negative for back pain. Skin: Negative for rash. Neurological: Negative for headaches, focal weakness or numbness.   ____________________________________________   PHYSICAL EXAM:  VITAL SIGNS: ED Triage Vitals  Enc Vitals Group     BP 04/03/17 1043 (!) 127/92     Pulse Rate 04/03/17 1043 90     Resp 04/03/17 1043 18     Temp 04/03/17 1043 97.8 F (36.6 C)     Temp Source 04/03/17 1043 Oral     SpO2 04/03/17 1043 97 %     Weight 04/03/17 1044 181 lb (82.1 kg)     Height --  Head Circumference --      Peak Flow --      Pain Score 04/03/17 1043 5     Pain Loc --      Pain Edu? --      Excl. in GC? --     Constitutional: Alert and oriented. Well appearing and in no acute distress. Eyes: Conjunctivae are normal. PERRL. EOMI. Head: Atraumatic. Nose: edematous nasal turbinates with rhinorrhea. Right maxillary guarding with palpation Mouth/Throat: Mucous membranes are moist.  Oropharynx non-erythematous. Postnasal drainage Neck: No stridor.  No cervical  spine tenderness to palpation. Hematological/Lymphatic/Immunilogical: No cervical lymphadenopathy. Cardiovascular: Normal rate, regular rhythm. Grossly normal heart sounds.  Good peripheral circulation. Respiratory: Normal respiratory effort.  No retractions. Lungs CTAB. Neurologic:  Normal speech and language. No gross focal neurologic deficits are appreciated. No gait instability. Skin:  Skin is warm, dry and intact. No rash noted. Psychiatric: Mood and affect are normal. Speech and behavior are normal.  ____________________________________________   LABS (all labs ordered are listed, but only abnormal results are displayed)  Labs Reviewed - No data to display ____________________________________________  EKG   ____________________________________________  RADIOLOGY   ____________________________________________   PROCEDURES  Procedure(s) performed: None  Procedures  Critical Care performed: No  ____________________________________________   INITIAL IMPRESSION / ASSESSMENT AND PLAN / ED COURSE  Pertinent labs & imaging results that were available during my care of the patient were reviewed by me and considered in my medical decision making (see chart for details).  Otalgia of right ear secondary to sinus infection. Patient given discharge care instructions. Patient advised follow up PCP his condition persists.      ____________________________________________   FINAL CLINICAL IMPRESSION(S) / ED DIAGNOSES  Final diagnoses:  None      NEW MEDICATIONS STARTED DURING THIS VISIT:  New Prescriptions   No medications on file     Note:  This document was prepared using Dragon voice recognition software and may include unintentional dictation errors.    Joni ReiningSmith, Ronald K, PA-C 04/03/17 1116    Jene EveryKinner, Robert, MD 04/03/17 1126

## 2018-03-20 ENCOUNTER — Emergency Department (HOSPITAL_COMMUNITY)
Admission: EM | Admit: 2018-03-20 | Discharge: 2018-03-20 | Disposition: A | Discharge status: Home / Self Care | Payer: BLUE CROSS/BLUE SHIELD | Attending: Emergency Medicine | Admitting: Emergency Medicine

## 2018-03-20 ENCOUNTER — Encounter (HOSPITAL_COMMUNITY): Payer: Self-pay | Admitting: Emergency Medicine

## 2018-03-20 DIAGNOSIS — J069 Acute upper respiratory infection, unspecified: Secondary | ICD-10-CM

## 2018-03-20 DIAGNOSIS — Z79899 Other long term (current) drug therapy: Secondary | ICD-10-CM | POA: Insufficient documentation

## 2018-03-20 MED ORDER — CETIRIZINE-PSEUDOEPHEDRINE ER 5-120 MG PO TB12: 1.0000 | ORAL_TABLET | Freq: Every day | ORAL | 0 refills | Status: DC

## 2018-03-20 MED ORDER — FLUTICASONE PROPIONATE 50 MCG/ACT NA SUSP: 1.0000 | Freq: Every day | NASAL | 0 refills | Status: DC

## 2018-03-20 MED ORDER — IBUPROFEN 600 MG PO TABS: 600.0000 mg | ORAL_TABLET | Freq: Four times a day (QID) | ORAL | 0 refills | Status: DC | PRN

## 2018-03-20 NOTE — ED Provider Notes (Signed)
[facility] EMERGENCY DEPARTMENT Provider Note   CSN: [id number] Arrival date & time: 03/20/18  1040     History   Chief Complaint Chief Complaint  Patient presents with  . Sore Throat  . Nasal Congestion    HPI Alexandria Jackson is a 32 y.o. female who presents with a sore throat. PMH significant for thyroid disease.  She states that her son had strep throat about a week and a half ago.  He was treated with amoxicillin got better.  She started to have a sore throat last Tuesday.  The symptoms worsened on Wednesday and she went to urgent care on Thursday.  She has strep swab done which was negative.  She was given a prescription for Ceftin ear which she has been taking but does not feel better.  She states her symptoms have actually worsened.  She reports ongoing sore throat, fever, bilateral ear pain, runny nose with green anal nasal drainage, cough.  She has been using cough drops and using honey and cough medicine with no significant relief.  She does work in a daycare.  She has not taken Tylenol, ibuprofen or any decongestants.  HPI  Past Medical History:  Diagnosis Date  . Thyroid disease     There are no active problems to display for this patient.   Past Surgical History:  Procedure Laterality Date  . TUBAL LIGATION       OB History   None      Home Medications    Prior to Admission medications   Medication Sig Start Date End Date Taking? Authorizing Provider  amoxicillin (AMOXIL) 500 MG capsule Take 1 capsule (500 mg total) by mouth 3 (three) times daily. 04/22/16   Joni Reining, PA-C  amoxicillin (AMOXIL) 500 MG capsule Take 1 capsule (500 mg total) by mouth 3 (three) times daily. 04/03/17   Joni Reining, PA-C  amoxicillin-clavulanate (AUGMENTIN) 875-125 MG tablet Take 1 tablet by mouth 2 (two) times daily. 02/10/16   Cuthriell, Delorise Royals, PA-C  benzonatate (TESSALON PERLES) 100 MG capsule Take 2 capsules (200 mg total) by mouth 3 (three)  times daily as needed for cough. 04/03/17 04/03/18  Joni Reining, PA-C  cetirizine (ZYRTEC) 10 MG tablet Take 1 tablet (10 mg total) by mouth daily. 02/10/16   Cuthriell, Delorise Royals, PA-C  ciprofloxacin (CIPRO) 500 MG tablet Take 1 tablet (500 mg total) by mouth every 12 (twelve) hours. 12/09/15   Payton Mccallum, MD  fexofenadine-pseudoephedrine (ALLEGRA-D) 60-120 MG 12 hr tablet Take 1 tablet by mouth 2 (two) times daily. 04/03/17   Joni Reining, PA-C  fluticasone (FLONASE) 50 MCG/ACT nasal spray Place 1 spray into both nostrils 2 (two) times daily. 02/10/16   Cuthriell, Delorise Royals, PA-C  ibuprofen (ADVIL,MOTRIN) 600 MG tablet Take 1 tablet (600 mg total) by mouth every 8 (eight) hours as needed. 04/03/17   Joni Reining, PA-C  lidocaine (XYLOCAINE) 2 % solution Use as directed 5 mLs in the mouth or throat every 6 (six) hours as needed for mouth pain. 04/22/16   Joni Reining, PA-C  naproxen (NAPROSYN) 500 MG tablet Take 1 tablet (500 mg total) by mouth 2 (two) times daily with a meal. 02/21/16   Sharman Cheek, MD  nitrofurantoin (MACRODANTIN) 100 MG capsule Take 1 capsule (100 mg total) by mouth 2 (two) times daily. 02/21/16   Sharman Cheek, MD    Family History No family history on file.  Social History Social History  Tobacco Use  . Smoking status: Never Smoker  . Smokeless tobacco: Never Used  Substance Use Topics  . Alcohol use: Yes  . Drug use: No     Allergies   Patient has no known allergies.   Review of Systems Review of Systems  Constitutional: Positive for fever.  HENT: Positive for congestion, ear pain, rhinorrhea and sore throat. Negative for trouble swallowing.   Respiratory: Positive for cough. Negative for shortness of breath.      Physical Exam Updated Vital Signs BP 129/89   Pulse (!) 101   Temp 98.3 F (36.8 C)   Resp 18   LMP 03/19/2018   SpO2 98%   Physical Exam  Constitutional: She is oriented to person, place, and time. She appears  well-developed and well-nourished. No distress.  Calm and cooperative  HENT:  Head: Normocephalic and atraumatic.  Right Ear: Hearing, tympanic membrane, external ear and ear canal normal.  Left Ear: Hearing, tympanic membrane, external ear and ear canal normal.  Nose: Nose normal.  Mouth/Throat: Uvula is midline and mucous membranes are normal. Posterior oropharyngeal erythema (Mild) present.  Eyes: Pupils are equal, round, and reactive to light. Conjunctivae are normal. Right eye exhibits no discharge. Left eye exhibits no discharge. No scleral icterus.  Neck: Normal range of motion.  Cardiovascular: Normal rate and regular rhythm.  Pulmonary/Chest: Effort normal and breath sounds normal. No respiratory distress.  Abdominal: She exhibits no distension.  Neurological: She is alert and oriented to person, place, and time.  Skin: Skin is warm and dry.  Psychiatric: She has a normal mood and affect. Her behavior is normal.  Nursing note and vitals reviewed.    ED Treatments / Results  Labs (all labs ordered are listed, but only abnormal results are displayed) Labs Reviewed - No data to display  EKG None  Radiology No results found.  Procedures Procedures (including critical care time)  Medications Ordered in ED Medications - No data to display   Initial Impression / Assessment and Plan / ED Course  I have reviewed the triage vital signs and the nursing notes.  Pertinent labs & imaging results that were available during my care of the patient were reviewed by me and considered in my medical decision making (see chart for details).  32 year old female presents with URI symptoms.  She had a negative strep test several days ago has been on antibiotics.  She is mildly tachycardic but otherwise vital signs are normal.  Exam is consistent with a viral URI.  Discussed with her that there is no specific treatment for a viral URI and an antibiotic will not necessarily make her symptoms  better.  I advised to try rest, fluids, antipyretics and Flonase and a decongestant.  She was given a work note and advised to return if worsening.  Final Clinical Impressions(s) / ED Diagnoses   Final diagnoses:  Upper respiratory tract infection, unspecified type    ED Discharge Orders    None       Bethel Born, PA-C 03/20/18 1129    Loren Racer, MD 03/24/18 1311

## 2018-03-20 NOTE — Discharge Instructions (Signed)
Please rest and drink plenty of fluids Take Zyrtec-D for congestion Use Flonase for congestion Take Ibuprofen for fever and pain Please return if worsening

## 2018-03-20 NOTE — ED Triage Notes (Signed)
Pt states sore throat since Weds. Seen at urgent care with son. Son was given amoxicillin and is better, the pt was given cefdinir and states no improvement. Also c.o. Green nasal drainage.

## 2018-03-20 NOTE — ED Notes (Signed)
Pt verbalized understanding discharge instructions and denies any further needs or questions at this time. VS stable, ambulatory and steady gait.   

## 2019-08-09 ENCOUNTER — Ambulatory Visit: Payer: Self-pay | Admitting: Family Medicine

## 2019-08-22 ENCOUNTER — Other Ambulatory Visit (HOSPITAL_COMMUNITY)
Admission: RE | Admit: 2019-08-22 | Discharge: 2019-08-22 | Disposition: A | Discharge status: Home / Self Care | Payer: Self-pay | Source: Ambulatory Visit | Attending: Adult Health Nurse Practitioner | Admitting: Adult Health Nurse Practitioner

## 2019-08-22 ENCOUNTER — Encounter: Payer: Self-pay | Admitting: Adult Health Nurse Practitioner

## 2019-08-22 ENCOUNTER — Ambulatory Visit (INDEPENDENT_AMBULATORY_CARE_PROVIDER_SITE_OTHER): Payer: PRIVATE HEALTH INSURANCE | Admitting: Adult Health Nurse Practitioner

## 2019-08-22 ENCOUNTER — Other Ambulatory Visit: Payer: Self-pay

## 2019-08-22 ENCOUNTER — Encounter: Payer: Self-pay | Admitting: *Deleted

## 2019-08-22 VITALS — BP 122/78 | HR 103 | Temp 99.1°F | Ht 63.0 in | Wt 174.0 lb

## 2019-08-22 DIAGNOSIS — N920 Excessive and frequent menstruation with regular cycle: Secondary | ICD-10-CM

## 2019-08-22 DIAGNOSIS — R5383 Other fatigue: Secondary | ICD-10-CM

## 2019-08-22 DIAGNOSIS — E039 Hypothyroidism, unspecified: Secondary | ICD-10-CM

## 2019-08-22 DIAGNOSIS — E559 Vitamin D deficiency, unspecified: Secondary | ICD-10-CM

## 2019-08-22 NOTE — Patient Instructions (Signed)
° ° ° °  If you have lab work done today you will be contacted with your lab results within the next 2 weeks.  If you have not heard from us then please contact us. The fastest way to get your results is to register for My Chart. ° ° °IF you received an x-ray today, you will receive an invoice from [facility]. Please contact [facility] at [phone] with questions or concerns regarding your invoice.  ° °IF you received labwork today, you will receive an invoice from [facility]. Please contact [facility] at [phone] with questions or concerns regarding your invoice.  ° °Our billing staff will not be able to assist you with questions regarding bills from these companies. ° °You will be contacted with the lab results as soon as they are available. The fastest way to get your results is to activate your My Chart account. Instructions are located on the last page of this paperwork. If you have not heard from us regarding the results in 2 weeks, please contact this office. °  ° ° ° °

## 2019-08-22 NOTE — Progress Notes (Signed)
Chief Complaint  Patient presents with  . Establish Care    pt stated re check --thyroid  . Menstrual Problem    heavy clotting bleeding--1 month    HPI   Patient presents  With a hx of abnormal thyroid and increased bleeding with menses.  Often last up to 7 days with heavy bleeding and clotting.  Notes some loss of energy, weight gain, and generalized fatigue.  Denies any depression.      Medications      Current Outpatient Medications:  .  amoxicillin (AMOXIL) 500 MG capsule, Take 1 capsule (500 mg total) by mouth 3 (three) times daily., Disp: 30 capsule, Rfl: 0 .  amoxicillin (AMOXIL) 500 MG capsule, Take 1 capsule (500 mg total) by mouth 3 (three) times daily., Disp: 30 capsule, Rfl: 0 .  amoxicillin-clavulanate (AUGMENTIN) 875-125 MG tablet, Take 1 tablet by mouth 2 (two) times daily., Disp: 14 tablet, Rfl: 0 .  cetirizine (ZYRTEC) 10 MG tablet, Take 1 tablet (10 mg total) by mouth daily., Disp: 30 tablet, Rfl: 0 .  cetirizine-pseudoephedrine (ZYRTEC-D) 5-120 MG tablet, Take 1 tablet by mouth daily., Disp: 30 tablet, Rfl: 0 .  ciprofloxacin (CIPRO) 500 MG tablet, Take 1 tablet (500 mg total) by mouth every 12 (twelve) hours., Disp: 10 tablet, Rfl: 0 .  fexofenadine-pseudoephedrine (ALLEGRA-D) 60-120 MG 12 hr tablet, Take 1 tablet by mouth 2 (two) times daily., Disp: 20 tablet, Rfl: 0 .  fluticasone (FLONASE) 50 MCG/ACT nasal spray, Place 1 spray into both nostrils daily., Disp: 9.9 g, Rfl: 0 .  ibuprofen (ADVIL,MOTRIN) 600 MG tablet, Take 1 tablet (600 mg total) by mouth every 6 (six) hours as needed., Disp: 30 tablet, Rfl: 0 .  lidocaine (XYLOCAINE) 2 % solution, Use as directed 5 mLs in the mouth or throat every 6 (six) hours as needed for mouth pain., Disp: 100 mL, Rfl: 0 .  naproxen (NAPROSYN) 500 MG tablet, Take 1 tablet (500 mg total) by mouth 2 (two) times daily with a meal., Disp: 20 tablet, Rfl: 0 .  nitrofurantoin (MACRODANTIN) 100 MG capsule, Take 1 capsule (100 mg  total) by mouth 2 (two) times daily., Disp: 6 capsule, Rfl: 0  Problem List    does not have any pertinent problems on file.   Assessment & Plan:   has No Known Allergies.   Review of Systems  Constitutional: Negative for activity change, appetite change, chills and fever.  HENT: Negative for congestion, nosebleeds, trouble swallowing and voice change.   Respiratory: Negative for cough, shortness of breath and wheezing.   Gastrointestinal: Negative for diarrhea, nausea and vomiting.  Genitourinary: Negative for difficulty urinating, dysuria, flank pain and hematuria.  Musculoskeletal: Negative for back pain, joint swelling and neck pain.  Neurological: Negative for dizziness, speech difficulty, light-headedness and numbness.  See HPI. All other review of systems negative.   ........... OBJECTIVE: BP 122/78 (BP Location: Right Arm, Patient Position: Sitting, Cuff Size: Normal)   Pulse (!) 103   Temp 99.1 F (37.3 C)   Ht 5\' 3"  (1.6 m)   Wt 174 lb (78.9 kg)   LMP 08/03/2019   SpO2 97%   BMI 30.82 kg/m   Appearance: alert, well appearing, and in no distress. General exam: Physical Exam  Constitutional: Oriented to person, place, and time. Appears well-developed and well-nourished.  HENT:  Head: Normocephalic and atraumatic.  Eyes: Conjunctivae and EOM are normal.  Cardiovascular: Normal rate, regular rhythm, normal heart sounds and intact distal pulses.  No murmur heard.  Pulmonary/Chest: Effort normal and breath sounds normal. No stridor. No respiratory distress. Has no wheezes.  Neurological: Is alert and oriented to person, place, and time.  Skin: Skin is warm. Capillary refill takes less than 2 seconds.  Psychiatric: Has a normal mood and affect. Behavior is normal. Judgment and thought content normal.  . Lab review: orders written for new lab studies as appropriate; see orders, no lab studies available for review at time of visit.    Assessment & Plan:  Alexandria Jackson  is a 33 y.o. female . Fatigue, unspecified type - Plan: Hemoglobin A1c  Vitamin D insufficiency - Plan: VITAMIN D 25 Hydroxy (Vit-D Deficiency, Fractures)  Acquired hypothyroidism No orders of the defined types were placed in this encounter.  1. Fatigue, unspecified type   2. Vitamin D insufficiency   3. Acquired hypothyroidism   4. Menorrhagia with regular cycle     Orders Placed This Encounter  Procedures  . Hemoglobin A1c  . VITAMIN D 25 Hydroxy (Vit-D Deficiency, Fractures)  . CBC with Differential  . Iron, TIBC and Ferritin Panel  . TSH   Will check CBC due to her hx of menorraghia.  Patient also has a hx of Vitamin D deficiency.     There are no Patient Instructions on file for this visit.    Elyse Jarvis, NP

## 2019-08-23 LAB — HEMOGLOBIN A1C
Est. average glucose Bld gHb Est-mCnc: 111 mg/dL
Hgb A1c MFr Bld: 5.5 % (ref 4.8–5.6)

## 2019-08-23 LAB — CBC WITH DIFFERENTIAL/PLATELET
Basophils Absolute: 0.1 10*3/uL (ref 0.0–0.2)
Basos: 1 %
EOS (ABSOLUTE): 0.1 10*3/uL (ref 0.0–0.4)
Eos: 2 %
Hematocrit: 36.9 % (ref 34.0–46.6)
Hemoglobin: 11.9 g/dL (ref 11.1–15.9)
Immature Grans (Abs): 0 10*3/uL (ref 0.0–0.1)
Immature Granulocytes: 0 %
Lymphocytes Absolute: 2.6 10*3/uL (ref 0.7–3.1)
Lymphs: 44 %
MCH: 26.9 pg (ref 26.6–33.0)
MCHC: 32.2 g/dL (ref 31.5–35.7)
MCV: 84 fL (ref 79–97)
Monocytes Absolute: 0.4 10*3/uL (ref 0.1–0.9)
Monocytes: 6 %
Neutrophils Absolute: 2.7 10*3/uL (ref 1.4–7.0)
Neutrophils: 47 %
Platelets: 381 10*3/uL (ref 150–450)
RBC: 4.42 x10E6/uL (ref 3.77–5.28)
RDW: 16 % — ABNORMAL HIGH (ref 11.7–15.4)
WBC: 5.9 10*3/uL (ref 3.4–10.8)

## 2019-08-23 LAB — IRON,TIBC AND FERRITIN PANEL
Ferritin: 5 ng/mL — ABNORMAL LOW (ref 15–150)
Iron Saturation: 7 % — CL (ref 15–55)
Iron: 30 ug/dL (ref 27–159)
Total Iron Binding Capacity: 432 ug/dL (ref 250–450)
UIBC: 402 ug/dL (ref 131–425)

## 2019-08-23 LAB — TSH: TSH: 1.18 u[IU]/mL (ref 0.450–4.500)

## 2019-08-23 LAB — VITAMIN D 25 HYDROXY (VIT D DEFICIENCY, FRACTURES): Vit D, 25-Hydroxy: 14.8 ng/mL — ABNORMAL LOW (ref 30.0–100.0)

## 2019-08-29 LAB — CYTOLOGY - PAP
Chlamydia: NEGATIVE
Comment: NEGATIVE
Comment: NEGATIVE
Comment: NORMAL
Diagnosis: NEGATIVE
Diagnosis: REACTIVE
Neisseria Gonorrhea: NEGATIVE
Trichomonas: NEGATIVE

## 2019-09-05 ENCOUNTER — Encounter: Payer: Self-pay | Admitting: Adult Health Nurse Practitioner

## 2019-09-05 ENCOUNTER — Ambulatory Visit (INDEPENDENT_AMBULATORY_CARE_PROVIDER_SITE_OTHER): Payer: PRIVATE HEALTH INSURANCE | Admitting: Adult Health Nurse Practitioner

## 2019-09-05 ENCOUNTER — Other Ambulatory Visit: Payer: Self-pay

## 2019-09-05 VITALS — BP 112/74 | HR 85 | Temp 99.2°F | Resp 16 | Ht 63.39 in | Wt 180.0 lb

## 2019-09-05 DIAGNOSIS — N920 Excessive and frequent menstruation with regular cycle: Secondary | ICD-10-CM | POA: Diagnosis not present

## 2019-09-05 DIAGNOSIS — R5383 Other fatigue: Secondary | ICD-10-CM | POA: Diagnosis not present

## 2019-09-05 DIAGNOSIS — D509 Iron deficiency anemia, unspecified: Secondary | ICD-10-CM

## 2019-09-05 DIAGNOSIS — D500 Iron deficiency anemia secondary to blood loss (chronic): Secondary | ICD-10-CM

## 2019-09-05 DIAGNOSIS — E559 Vitamin D deficiency, unspecified: Secondary | ICD-10-CM

## 2019-09-05 HISTORY — DX: Excessive and frequent menstruation with regular cycle: N92.0

## 2019-09-05 HISTORY — DX: Iron deficiency anemia, unspecified: D50.9

## 2019-09-05 MED ORDER — FERROUS SULFATE 325 (65 FE) MG PO TABS: 325.0000 mg | ORAL_TABLET | Freq: Two times a day (BID) | ORAL | 3 refills | Status: DC

## 2019-09-05 MED ORDER — VITAMIN D (ERGOCALCIFEROL) 1.25 MG (50000 UNIT) PO CAPS: 50000.0000 [IU] | ORAL_CAPSULE | ORAL | 2 refills | Status: DC

## 2019-09-05 MED ORDER — NORETHIN ACE-ETH ESTRAD-FE 1-20 MG-MCG PO TABS: 1.0000 | ORAL_TABLET | Freq: Every day | ORAL | 11 refills | Status: DC

## 2019-09-05 NOTE — Progress Notes (Signed)
Established Patient Office Visit  Subjective:  Patient ID: Alexandria Jackson, female    DOB: 06-Aug-1986  Age: 33 y.o. MRN: [id number]  CC:  Chief Complaint  Patient presents with  . Contraception    no birth control, IUD, etc at this time.  has had tubes tied but is having heavy bleeding.    Marland Kitchen Results    HPI Alexandria Jackson presents for f/u of labs and discussion of OCPs for menses.  Patient's CBC, Ferritin and Iron Sat came back severely low.  We discussed the need to take Iron which she does not usually take.  She does not like to put things in her body but is having very heavy periods depleting her iron supply.  Ferritin of 5.  She is willing to start OCPs.  Reviewed risks and benefits as well as need to take at the same time everyday to prevent spotting.  She does not need for birth control.  Tubes tied.   Past Medical History:  Diagnosis Date  . Allergy   . Anxiety   . Thyroid disease     Past Surgical History:  Procedure Laterality Date  . TUBAL LIGATION      Family History  Problem Relation Age of Onset  . Cancer Mother     Social History   Socioeconomic History  . Marital status: Married    Spouse name: Not on file  . Number of children: Not on file  . Years of education: Not on file  . Highest education level: Not on file  Occupational History  . Not on file  Social Needs  . Financial resource strain: Not on file  . Food insecurity    Worry: Not on file    Inability: Not on file  . Transportation needs    Medical: Not on file    Non-medical: Not on file  Tobacco Use  . Smoking status: Never Smoker  . Smokeless tobacco: Never Used  Substance and Sexual Activity  . Alcohol use: Yes    Comment: occasion  . Drug use: No  . Sexual activity: Yes  Lifestyle  . Physical activity    Days per week: Not on file    Minutes per session: Not on file  . Stress: Not on file  Relationships  . Social Musician on phone: Not on file    Gets together:  Not on file    Attends religious service: Not on file    Active member of club or organization: Not on file    Attends meetings of clubs or organizations: Not on file    Relationship status: Not on file  . Intimate partner violence    Fear of current or ex partner: Not on file    Emotionally abused: Not on file    Physically abused: Not on file    Forced sexual activity: Not on file  Other Topics Concern  . Not on file  Social History Narrative  . Not on file    Outpatient Medications Prior to Visit  Medication Sig Dispense Refill  . cetirizine (ZYRTEC) 10 MG tablet Take 1 tablet (10 mg total) by mouth daily. 30 tablet 0  . fexofenadine-pseudoephedrine (ALLEGRA-D) 60-120 MG 12 hr tablet Take 1 tablet by mouth 2 (two) times daily. 20 tablet 0  . ibuprofen (ADVIL,MOTRIN) 600 MG tablet Take 1 tablet (600 mg total) by mouth every 6 (six) hours as needed. 30 tablet 0   No facility-administered medications prior  to visit.     No Known Allergies  ROS Review of Systems  Review of Systems See HPI Constitution: No fevers or chills No malaise, positive for fatigue  No diaphoresis Skin: No rash or itching Eyes: no blurry vision, no double vision GU: no dysuria or hematuria Neuro: no dizziness or headaches     Objective:    Physical Exam  BP 112/74 (BP Location: Left Arm, Patient Position: Sitting, Cuff Size: Normal)   Pulse 85   Temp 99.2 F (37.3 C) (Oral)   Resp 16   Ht 5' 3.39" (1.61 m)   Wt 180 lb (81.6 kg)   SpO2 99%   BMI 31.50 kg/m  Wt Readings from Last 3 Encounters:  09/05/19 180 lb (81.6 kg)  08/22/19 174 lb (78.9 kg)  04/03/17 181 lb (82.1 kg)    GEN: WDWN, NAD, Non-toxic, Alert & Oriented x 3 HEENT: Atraumatic, Normocephalic.  Ears and Nose: No external deformity. Resp:  CTAB Cardiac:  S1, S2, RRR.  No murmur auscultated.  EXTR: No clubbing/cyanosis/edema NEURO: Normal gait.  PSYCH: Normally interactive. Conversant. Not depressed or anxious  appearing.  Calm demeanor.    Health Maintenance Due  Topic Date Due  . HIV Screening  08/28/1999    There are no preventive care reminders to display for this patient.  Lab Results  Component Value Date   TSH 1.180 08/22/2019   Lab Results  Component Value Date   WBC 5.9 08/22/2019   HGB 11.9 08/22/2019   HCT 36.9 08/22/2019   MCV 84 08/22/2019   PLT 381 08/22/2019   No results found for: CHOLHDL Lab Results  Component Value Date   HGBA1C 5.5 08/22/2019      Assessment & Plan:   Problem List Items Addressed This Visit    None      Meds ordered this encounter  Medications  . Vitamin D, Ergocalciferol, (DRISDOL) 1.25 MG (50000 UT) CAPS capsule    Sig: Take 1 capsule (50,000 Units total) by mouth every 7 (seven) days.    Dispense:  5 capsule    Refill:  2  . ferrous sulfate (FERROUSUL) 325 (65 FE) MG tablet    Sig: Take 1 tablet (325 mg total) by mouth 2 (two) times daily with a meal.    Dispense:  60 tablet    Refill:  3  . norethindrone-ethinyl estradiol (LOESTRIN FE 1/20) 1-20 MG-MCG tablet    Sig: Take 1 tablet by mouth daily.    Dispense:  1 Package    Refill:  11    Follow-up:   2-3 months to evaluate menorrhagia and recheck Vitamin D and CBC.  She is inline with this plan.    Glyn Ade, NP

## 2019-09-15 ENCOUNTER — Emergency Department (HOSPITAL_COMMUNITY): Payer: PRIVATE HEALTH INSURANCE

## 2019-09-15 ENCOUNTER — Encounter (HOSPITAL_COMMUNITY): Payer: Self-pay | Admitting: Emergency Medicine

## 2019-09-15 ENCOUNTER — Other Ambulatory Visit: Payer: Self-pay

## 2019-09-15 ENCOUNTER — Emergency Department (HOSPITAL_COMMUNITY)
Admission: EM | Admit: 2019-09-15 | Discharge: 2019-09-15 | Disposition: A | Discharge status: Home / Self Care | Payer: PRIVATE HEALTH INSURANCE | Attending: Emergency Medicine | Admitting: Emergency Medicine

## 2019-09-15 DIAGNOSIS — E039 Hypothyroidism, unspecified: Secondary | ICD-10-CM | POA: Diagnosis not present

## 2019-09-15 DIAGNOSIS — N200 Calculus of kidney: Secondary | ICD-10-CM | POA: Insufficient documentation

## 2019-09-15 DIAGNOSIS — R1032 Left lower quadrant pain: Secondary | ICD-10-CM | POA: Diagnosis present

## 2019-09-15 DIAGNOSIS — N390 Urinary tract infection, site not specified: Secondary | ICD-10-CM | POA: Diagnosis not present

## 2019-09-15 DIAGNOSIS — Z79899 Other long term (current) drug therapy: Secondary | ICD-10-CM | POA: Insufficient documentation

## 2019-09-15 DIAGNOSIS — R102 Pelvic and perineal pain: Secondary | ICD-10-CM | POA: Diagnosis not present

## 2019-09-15 LAB — COMPREHENSIVE METABOLIC PANEL
ALT: 9 U/L (ref 0–44)
AST: 20 U/L (ref 15–41)
Albumin: 3.7 g/dL (ref 3.5–5.0)
Alkaline Phosphatase: 74 U/L (ref 38–126)
Anion gap: 9 (ref 5–15)
BUN: 14 mg/dL (ref 6–20)
CO2: 20 mmol/L — ABNORMAL LOW (ref 22–32)
Calcium: 8.5 mg/dL — ABNORMAL LOW (ref 8.9–10.3)
Chloride: 109 mmol/L (ref 98–111)
Creatinine, Ser: 0.85 mg/dL (ref 0.44–1.00)
GFR calc Af Amer: >60 mL/min (ref >60)
GFR calc non Af Amer: >60 mL/min (ref >60)
Glucose, Bld: 151 mg/dL — ABNORMAL HIGH (ref 70–99)
Potassium: 3.4 mmol/L — ABNORMAL LOW (ref 3.5–5.1)
Sodium: 138 mmol/L (ref 135–145)
Total Bilirubin: 0.5 mg/dL (ref 0.3–1.2)
Total Protein: 7.9 g/dL (ref 6.5–8.1)

## 2019-09-15 LAB — CBC
HCT: 37.1 % (ref 36.0–46.0)
Hemoglobin: 11.8 g/dL — ABNORMAL LOW (ref 12.0–15.0)
MCH: 27.2 pg (ref 26.0–34.0)
MCHC: 31.8 g/dL (ref 30.0–36.0)
MCV: 85.5 fL (ref 80.0–100.0)
Platelets: 393 10*3/uL (ref 150–400)
RBC: 4.34 MIL/uL (ref 3.87–5.11)
RDW: 15.6 % — ABNORMAL HIGH (ref 11.5–15.5)
WBC: 11.4 10*3/uL — ABNORMAL HIGH (ref 4.0–10.5)
nRBC: 0 % (ref 0.0–0.2)

## 2019-09-15 LAB — URINALYSIS, ROUTINE W REFLEX MICROSCOPIC
Bacteria, UA: NONE SEEN
Bilirubin Urine: NEGATIVE
Glucose, UA: NEGATIVE mg/dL
Ketones, ur: 20 mg/dL — AB
Nitrite: NEGATIVE
Protein, ur: 30 mg/dL — AB
Specific Gravity, Urine: 1.023 (ref 1.005–1.030)
pH: 5 (ref 5.0–8.0)

## 2019-09-15 LAB — I-STAT BETA HCG BLOOD, ED (MC, WL, AP ONLY): I-stat hCG, quantitative: <5 m[IU]/mL (ref <5)

## 2019-09-15 MED ORDER — LORAZEPAM 2 MG/ML IJ SOLN
0.5000 mg | Freq: Once | INTRAMUSCULAR | Status: AC
  Administered 2019-09-15: 0.5 mg via INTRAVENOUS
  Filled 2019-09-15: qty 1

## 2019-09-15 MED ORDER — OXYCODONE-ACETAMINOPHEN 5-325 MG PO TABS: 1.0000 | ORAL_TABLET | ORAL | 0 refills | Status: DC | PRN

## 2019-09-15 MED ORDER — SODIUM CHLORIDE (PF) 0.9 % IJ SOLN
INTRAMUSCULAR | Status: AC
  Filled 2019-09-15: qty 50

## 2019-09-15 MED ORDER — KETOROLAC TROMETHAMINE 30 MG/ML IJ SOLN
30.0000 mg | Freq: Once | INTRAMUSCULAR | Status: AC
  Administered 2019-09-15: 30 mg via INTRAVENOUS
  Filled 2019-09-15: qty 1

## 2019-09-15 MED ORDER — IOHEXOL 300 MG/ML  SOLN
100.0000 mL | Freq: Once | INTRAMUSCULAR | Status: AC | PRN
  Administered 2019-09-15: 100 mL via INTRAVENOUS

## 2019-09-15 MED ORDER — MORPHINE SULFATE (PF) 4 MG/ML IV SOLN
6.0000 mg | Freq: Once | INTRAVENOUS | Status: AC
  Administered 2019-09-15: 6 mg via INTRAVENOUS
  Filled 2019-09-15: qty 2

## 2019-09-15 MED ORDER — ONDANSETRON 8 MG PO TBDP: 8.0000 mg | ORAL_TABLET | Freq: Three times a day (TID) | ORAL | 0 refills | Status: DC | PRN

## 2019-09-15 MED ORDER — HYDROMORPHONE HCL 1 MG/ML IJ SOLN
1.0000 mg | Freq: Once | INTRAMUSCULAR | Status: AC
  Administered 2019-09-15: 1 mg via INTRAVENOUS
  Filled 2019-09-15: qty 1

## 2019-09-15 MED ORDER — SODIUM CHLORIDE 0.9 % IV SOLN
1.0000 g | INTRAVENOUS | Status: DC
  Administered 2019-09-15: 1 g via INTRAVENOUS
  Filled 2019-09-15: qty 10

## 2019-09-15 MED ORDER — LORAZEPAM 2 MG/ML IJ SOLN
1.0000 mg | Freq: Once | INTRAMUSCULAR | Status: AC
  Administered 2019-09-15: 1 mg via INTRAVENOUS
  Filled 2019-09-15: qty 1

## 2019-09-15 MED ORDER — CEPHALEXIN 500 MG PO CAPS: 500.0000 mg | ORAL_CAPSULE | Freq: Four times a day (QID) | ORAL | 0 refills | Status: DC

## 2019-09-15 MED ORDER — SODIUM CHLORIDE 0.9% FLUSH
3.0000 mL | Freq: Once | INTRAVENOUS | Status: AC
  Administered 2019-09-15: 3 mL via INTRAVENOUS

## 2019-09-15 MED ORDER — METOCLOPRAMIDE HCL 5 MG/ML IJ SOLN
5.0000 mg | Freq: Once | INTRAMUSCULAR | Status: AC
  Administered 2019-09-15: 08:00:00 5 mg via INTRAVENOUS
  Filled 2019-09-15: qty 2

## 2019-09-15 NOTE — ED Provider Notes (Signed)
[facility]-EMERGENCY DEPT Provider Note   CSN: [id number] Arrival date & time: 09/15/19  9150     History   Chief Complaint Chief Complaint  Patient presents with  . Abdominal Pain    HPI Alexandria Jackson is a 33 y.o. female.     33 year old female presents with acute onset of left lower quadrant abdominal pain which began this morning when she was trying to have a bowel movement.  Patient states she has had nausea vomiting.  No fever or chills.  No vaginal bleeding.  Last menstrual period was about a month ago.  No prior history of same.  No prior surgeries.  Pain characterizes sharp and worse with any movement.  No treatment use prior to arrival.     Past Medical History:  Diagnosis Date  . Allergy   . Anxiety   . Iron deficiency anemia 09/05/2019  . Menorrhagia 09/05/2019  . Thyroid disease     Patient Active Problem List   Diagnosis Date Noted  . Iron deficiency anemia 09/05/2019  . Menorrhagia 09/05/2019  . Acquired hypothyroidism 08/22/2019  . Acne vulgaris 10/08/2016  . Situational anxiety 10/08/2016  . Hyperthyroidism 03/13/2015  . Vitamin D insufficiency 03/13/2015  . Fatigue 12/26/2014  . Headache, unspecified headache type 12/26/2014  . 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 TUBAL LIGATION       OB History   No obstetric history on file.      Home Medications    Prior to Admission medications   Medication Sig Start Date End Date Taking? Authorizing Provider  cetirizine (ZYRTEC) 10 MG tablet Take 1 tablet (10 mg total) by mouth daily. 02/10/16   Cuthriell, Delorise Royals, PA-C  ferrous sulfate (FERROUSUL) 325 (65 FE) MG tablet Take 1 tablet (325 mg total) by mouth 2 (two) times daily with a meal. 09/05/19 10/05/19  Royal Hawthorn, NP  fexofenadine-pseudoephedrine (ALLEGRA-D) 60-120 MG 12 hr tablet Take 1 tablet by mouth 2 (two) times daily. 04/03/17   Joni Reining, PA-C   ibuprofen (ADVIL,MOTRIN) 600 MG tablet Take 1 tablet (600 mg total) by mouth every 6 (six) hours as needed. 03/20/18   Bethel Born, PA-C  norethindrone-ethinyl estradiol (LOESTRIN FE 1/20) 1-20 MG-MCG tablet Take 1 tablet by mouth daily. 09/05/19   Royal Hawthorn, NP  Vitamin D, Ergocalciferol, (DRISDOL) 1.25 MG (50000 UT) CAPS capsule Take 1 capsule (50,000 Units total) by mouth every 7 (seven) days. 09/05/19   Royal Hawthorn, NP    Family History Family History  Problem Relation Age of Onset  . Cancer Mother     Social History Social History   Tobacco Use  . Smoking status: Never Smoker  . Smokeless tobacco: Never Used  Substance Use Topics  . Alcohol use: Yes    Comment: occasion  . Drug use: No     Allergies   Patient has no known allergies.   Review of Systems Review of Systems  All other systems reviewed and are negative.    Physical Exam Updated Vital Signs BP (!) 138/91 (BP Location: Right Arm)   Pulse 88   Temp 98.1 F (36.7 C) (Oral)   Resp (!) 23   Ht 1.6 m (5\' 3" )   Wt 81.6 kg   LMP  (Within Weeks)   SpO2 99%   BMI 31.87 kg/m   Physical Exam Vitals signs and nursing note reviewed.  Constitutional:  General: She is not in acute distress.    Appearance: Normal appearance. She is well-developed. She is not toxic-appearing.  HENT:     Head: Normocephalic and atraumatic.  Eyes:     General: Lids are normal.     Conjunctiva/sclera: Conjunctivae normal.     Pupils: Pupils are equal, round, and reactive to light.  Neck:     Musculoskeletal: Normal range of motion and neck supple.     Thyroid: No thyroid mass.     Trachea: No tracheal deviation.  Cardiovascular:     Rate and Rhythm: Normal rate and regular rhythm.     Heart sounds: Normal heart sounds. No murmur. No gallop.   Pulmonary:     Effort: Pulmonary effort is normal. No respiratory distress.     Breath sounds: Normal breath sounds. No stridor. No decreased breath  sounds, wheezing, rhonchi or rales.  Abdominal:     General: Bowel sounds are normal. There is no distension.     Palpations: Abdomen is soft.     Tenderness: There is abdominal tenderness in the left lower quadrant. There is guarding. There is no rebound.    Musculoskeletal: Normal range of motion.        General: No tenderness.  Skin:    General: Skin is warm and dry.     Findings: No abrasion or rash.  Neurological:     Mental Status: She is alert and oriented to person, place, and time.     GCS: GCS eye subscore is 4. GCS verbal subscore is 5. GCS motor subscore is 6.     Cranial Nerves: No cranial nerve deficit.     Sensory: No sensory deficit.  Psychiatric:        Speech: Speech normal.        Behavior: Behavior normal.      ED Treatments / Results  Labs (all labs ordered are listed, but only abnormal results are displayed) Labs Reviewed  COMPREHENSIVE METABOLIC PANEL  CBC  URINALYSIS, ROUTINE W REFLEX MICROSCOPIC  I-STAT BETA HCG BLOOD, ED ([facility], [facility], [facility] ONLY)    EKG None  Radiology No results found.  Procedures Procedures (including critical care time)  Medications Ordered in ED Medications  LORazepam (ATIVAN) injection 0.5 mg (has no administration in time range)  morphine 4 MG/ML injection 6 mg (has no administration in time range)  metoCLOPramide (REGLAN) injection 5 mg (has no administration in time range)  sodium chloride flush (NS) 0.9 % injection 3 mL (3 mLs Intravenous Given 09/15/19 0732)     Initial Impression / Assessment and Plan / ED Course  I have reviewed the triage vital signs and the nursing notes.  Pertinent labs & imaging results that were available during my care of the patient were reviewed by me and considered in my medical decision making (see chart for details).        Patient with concern for possible ovarian torsion and pelvic ultrasound negative for this.  Was medicated for pain here.  Urinalysis does show infection as well  as red blood cells.  Patient subsequently had a CT of the abdomen and pelvis which showed a left-sided kidney stone measuring 3 mm.  Patient's pain is controlled at time of discharge and will give referral to urology as well as placed on antibiotics. Final Clinical Impressions(s) / ED Diagnoses   Final diagnoses:  None    ED Discharge Orders    None       Lacretia Leigh, MD 09/15/19  1320  

## 2019-09-15 NOTE — ED Notes (Signed)
Will discharge after antibiotic administration.

## 2019-09-15 NOTE — ED Triage Notes (Signed)
Per EMS: Pt is coming from home with c/o left abdominal pain. Pt reports having nausea and vomiting. Pt rates pain a 10/10 and it does not radiate. No masses or throbbing felt on palpation.   EMS VITALS: BP 120/80 HR 92 RR 22 SPO2 98%

## 2019-09-17 ENCOUNTER — Encounter (HOSPITAL_COMMUNITY): Payer: Self-pay | Admitting: Emergency Medicine

## 2019-09-17 ENCOUNTER — Emergency Department (HOSPITAL_COMMUNITY)
Admission: EM | Admit: 2019-09-17 | Discharge: 2019-09-17 | Disposition: A | Discharge status: Home / Self Care | Payer: PRIVATE HEALTH INSURANCE | Attending: Emergency Medicine | Admitting: Emergency Medicine

## 2019-09-17 ENCOUNTER — Other Ambulatory Visit: Payer: Self-pay

## 2019-09-17 DIAGNOSIS — Z79899 Other long term (current) drug therapy: Secondary | ICD-10-CM | POA: Diagnosis not present

## 2019-09-17 DIAGNOSIS — N23 Unspecified renal colic: Secondary | ICD-10-CM | POA: Diagnosis not present

## 2019-09-17 DIAGNOSIS — R112 Nausea with vomiting, unspecified: Secondary | ICD-10-CM | POA: Insufficient documentation

## 2019-09-17 DIAGNOSIS — R1032 Left lower quadrant pain: Secondary | ICD-10-CM | POA: Diagnosis present

## 2019-09-17 DIAGNOSIS — E038 Other specified hypothyroidism: Secondary | ICD-10-CM | POA: Insufficient documentation

## 2019-09-17 LAB — CBC WITH DIFFERENTIAL/PLATELET
Abs Immature Granulocytes: 0.02 10*3/uL (ref 0.00–0.07)
Basophils Absolute: 0 10*3/uL (ref 0.0–0.1)
Basophils Relative: 1 %
Eosinophils Absolute: 0.1 10*3/uL (ref 0.0–0.5)
Eosinophils Relative: 1 %
HCT: 36.8 % (ref 36.0–46.0)
Hemoglobin: 11.6 g/dL — ABNORMAL LOW (ref 12.0–15.0)
Immature Granulocytes: 0 %
Lymphocytes Relative: 37 %
Lymphs Abs: 3.2 10*3/uL (ref 0.7–4.0)
MCH: 26.7 pg (ref 26.0–34.0)
MCHC: 31.5 g/dL (ref 30.0–36.0)
MCV: 84.6 fL (ref 80.0–100.0)
Monocytes Absolute: 0.4 10*3/uL (ref 0.1–1.0)
Monocytes Relative: 5 %
Neutro Abs: 5 10*3/uL (ref 1.7–7.7)
Neutrophils Relative %: 56 %
Platelets: 357 10*3/uL (ref 150–400)
RBC: 4.35 MIL/uL (ref 3.87–5.11)
RDW: 15.2 % (ref 11.5–15.5)
WBC: 8.7 10*3/uL (ref 4.0–10.5)
nRBC: 0 % (ref 0.0–0.2)

## 2019-09-17 LAB — URINALYSIS, ROUTINE W REFLEX MICROSCOPIC
Bilirubin Urine: NEGATIVE
Glucose, UA: NEGATIVE mg/dL
Ketones, ur: NEGATIVE mg/dL
Nitrite: NEGATIVE
Protein, ur: 30 mg/dL — AB
RBC / HPF: >50 RBC/hpf — ABNORMAL HIGH (ref 0–5)
Specific Gravity, Urine: 1.011 (ref 1.005–1.030)
pH: 6 (ref 5.0–8.0)

## 2019-09-17 LAB — BASIC METABOLIC PANEL
Anion gap: 9 (ref 5–15)
BUN: 12 mg/dL (ref 6–20)
CO2: 23 mmol/L (ref 22–32)
Calcium: 9 mg/dL (ref 8.9–10.3)
Chloride: 106 mmol/L (ref 98–111)
Creatinine, Ser: 0.65 mg/dL (ref 0.44–1.00)
GFR calc Af Amer: >60 mL/min (ref >60)
GFR calc non Af Amer: >60 mL/min (ref >60)
Glucose, Bld: 96 mg/dL (ref 70–99)
Potassium: 3.7 mmol/L (ref 3.5–5.1)
Sodium: 138 mmol/L (ref 135–145)

## 2019-09-17 LAB — I-STAT BETA HCG BLOOD, ED (MC, WL, AP ONLY): I-stat hCG, quantitative: <5 m[IU]/mL (ref <5)

## 2019-09-17 MED ORDER — SODIUM CHLORIDE 0.9 % IV BOLUS
1000.0000 mL | Freq: Once | INTRAVENOUS | Status: AC
  Administered 2019-09-17: 1000 mL via INTRAVENOUS

## 2019-09-17 MED ORDER — ONDANSETRON HCL 4 MG PO TABS: 4.0000 mg | ORAL_TABLET | Freq: Four times a day (QID) | ORAL | 0 refills | Status: DC

## 2019-09-17 MED ORDER — KETOROLAC TROMETHAMINE 30 MG/ML IJ SOLN
15.0000 mg | Freq: Once | INTRAMUSCULAR | Status: AC
  Administered 2019-09-17: 15 mg via INTRAVENOUS
  Filled 2019-09-17: qty 1

## 2019-09-17 MED ORDER — ONDANSETRON HCL 4 MG/2ML IJ SOLN
4.0000 mg | Freq: Once | INTRAMUSCULAR | Status: AC
  Administered 2019-09-17: 4 mg via INTRAVENOUS
  Filled 2019-09-17: qty 2

## 2019-09-17 MED ORDER — IBUPROFEN 600 MG PO TABS: 600.0000 mg | ORAL_TABLET | Freq: Four times a day (QID) | ORAL | 0 refills | Status: DC | PRN

## 2019-09-17 NOTE — ED Triage Notes (Signed)
Patient here from home with complaints of left side abd pain radiating around to left flank. See for same on 11/13. "Im not getting better".

## 2019-09-17 NOTE — Discharge Instructions (Signed)
Please return for any problem.  Follow-up with your regular care provider as instructed.  Follow-up with [facility] tomorrow as instructed.  It is important to drink plenty of fluids.  Use ibuprofen -600 mg taken orally every 8 hours -in addition to other pain medications for good pain control.    Complete entire course of Keflex as previously prescribed.

## 2019-09-17 NOTE — ED Provider Notes (Signed)
[facility]-EMERGENCY DEPT Provider Note   CSN: [id number] Arrival date & time: 09/17/19  1655     History   Chief Complaint Chief Complaint  Patient presents with  . Abdominal Pain  . Nausea  . Flank Pain  . Emesis    HPI Alexandria MunroeMarina J Jackson is a 33 y.o. female.     33 year old female with prior medical history as detailed below presents for evaluation of persistent left-sided flank pain.  Patient was seen at this facility 2 days prior for same.  She was diagnosed at the left 3 mm ureteral stone.  She reports that her pain has been uncontrolled at home.  She last took pain medications (Percocet) around 3 PM today.  She reports associated nausea and vomiting.  She denies fever.  She was treated with a course of Keflex after discharge from her prior evaluation.    The history is provided by the patient and medical records.  Abdominal Pain Pain location:  LLQ Pain quality: aching   Pain severity:  Mild Onset quality:  Gradual Duration:  2 days Timing:  Constant Progression:  Waxing and waning Chronicity:  New Relieved by:  Nothing Worsened by:  Nothing Associated symptoms: vomiting   Flank Pain Associated symptoms include abdominal pain.  Emesis Associated symptoms: abdominal pain     Past Medical History:  Diagnosis Date  . Allergy   . Anxiety   . Iron deficiency anemia 09/05/2019  . Menorrhagia 09/05/2019  . Thyroid disease     Patient Active Problem List   Diagnosis Date Noted  . Iron deficiency anemia 09/05/2019  . Menorrhagia 09/05/2019  . Acquired hypothyroidism 08/22/2019  . Acne vulgaris 10/08/2016  . Situational anxiety 10/08/2016  . Hyperthyroidism 03/13/2015  . Vitamin D insufficiency 03/13/2015  . Fatigue 12/26/2014  . Headache, unspecified headache type 12/26/2014  . 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 TUBAL LIGATION       OB History   No obstetric history on  file.      Home Medications    Prior to Admission medications   Medication Sig Start Date End Date Taking? Authorizing Provider  cephALEXin (KEFLEX) 500 MG capsule Take 1 capsule (500 mg total) by mouth 4 (four) times daily. 09/15/19  Yes Lorre NickAllen, Anthony, MD  fexofenadine-pseudoephedrine (ALLEGRA-D) 60-120 MG 12 hr tablet Take 1 tablet by mouth 2 (two) times daily. Patient taking differently: Take 1 tablet by mouth daily as needed (allergies).  04/03/17  Yes Joni ReiningSmith, Ronald K, PA-C  ibuprofen (ADVIL,MOTRIN) 600 MG tablet Take 1 tablet (600 mg total) by mouth every 6 (six) hours as needed. 03/20/18  Yes Bethel BornGekas, Kelly Marie, PA-C  oxyCODONE-acetaminophen (PERCOCET/ROXICET) 5-325 MG tablet Take 1-2 tablets by mouth every 4 (four) hours as needed for severe pain. 09/15/19  Yes Lorre NickAllen, Anthony, MD  Vitamin D, Ergocalciferol, (DRISDOL) 1.25 MG (50000 UT) CAPS capsule Take 1 capsule (50,000 Units total) by mouth every 7 (seven) days. 09/05/19  Yes Royal HawthornByrd, Sarah Arnette, NP  cetirizine (ZYRTEC) 10 MG tablet Take 1 tablet (10 mg total) by mouth daily. Patient not taking: Reported on 09/17/2019 02/10/16   Cuthriell, Delorise RoyalsJonathan D, PA-C  ferrous sulfate (FERROUSUL) 325 (65 FE) MG tablet Take 1 tablet (325 mg total) by mouth 2 (two) times daily with a meal. Patient not taking: Reported on 09/17/2019 09/05/19 10/05/19  Royal HawthornByrd, Sarah Arnette, NP  norethindrone-ethinyl estradiol (LOESTRIN FE 1/20) 1-20 MG-MCG tablet Take 1 tablet by  mouth daily. Patient not taking: Reported on 09/17/2019 09/05/19   Wendall Mola, NP  ondansetron (ZOFRAN ODT) 8 MG disintegrating tablet Take 1 tablet (8 mg total) by mouth every 8 (eight) hours as needed for nausea or vomiting. Patient not taking: Reported on 09/17/2019 09/15/19   Lacretia Leigh, MD    Family History Family History  Problem Relation Age of Onset  . Cancer Mother     Social History Social History   Tobacco Use  . Smoking status: Never Smoker  . Smokeless tobacco:  Never Used  Substance Use Topics  . Alcohol use: Yes    Comment: occasion  . Drug use: No     Allergies   Patient has no known allergies.   Review of Systems Review of Systems  Gastrointestinal: Positive for abdominal pain and vomiting.  Genitourinary: Positive for flank pain.  All other systems reviewed and are negative.    Physical Exam Updated Vital Signs BP (!) 132/101 (BP Location: Left Arm)   Pulse (!) 54   Temp 98 F (36.7 C) (Oral)   Resp 16   SpO2 99%   Physical Exam Vitals signs and nursing note reviewed.  Constitutional:      General: She is not in acute distress.    Appearance: She is well-developed.  HENT:     Head: Normocephalic and atraumatic.  Eyes:     Conjunctiva/sclera: Conjunctivae normal.     Pupils: Pupils are equal, round, and reactive to light.  Neck:     Musculoskeletal: Normal range of motion and neck supple.  Cardiovascular:     Rate and Rhythm: Normal rate and regular rhythm.     Heart sounds: Normal heart sounds.  Pulmonary:     Effort: Pulmonary effort is normal. No respiratory distress.     Breath sounds: Normal breath sounds.  Abdominal:     General: There is no distension.     Palpations: Abdomen is soft.     Tenderness: There is no abdominal tenderness.  Genitourinary:    Comments: Mild left CVAT  Musculoskeletal: Normal range of motion.        General: No deformity.  Skin:    General: Skin is warm and dry.  Neurological:     Mental Status: She is alert and oriented to person, place, and time.      ED Treatments / Results  Labs (all labs ordered are listed, but only abnormal results are displayed) Labs Reviewed  URINALYSIS, ROUTINE W REFLEX MICROSCOPIC - Abnormal; Notable for the following components:      Result Value   Hgb urine dipstick LARGE (*)    Protein, ur 30 (*)    Leukocytes,Ua SMALL (*)    RBC / HPF >50 (*)    Bacteria, UA RARE (*)    All other components within normal limits  CBC WITH  DIFFERENTIAL/PLATELET - Abnormal; Notable for the following components:   Hemoglobin 11.6 (*)    All other components within normal limits  URINE CULTURE  BASIC METABOLIC PANEL  I-STAT BETA HCG BLOOD, ED ([facility], [facility], [facility] ONLY)    EKG None  Radiology No results found.  Procedures Procedures (including critical care time)  Medications Ordered in ED Medications  sodium chloride 0.9 % bolus 1,000 mL (1,000 mLs Intravenous New Bag/Given 09/17/19 2008)  ketorolac (TORADOL) 30 MG/ML injection 15 mg (15 mg Intravenous Given 09/17/19 2008)  ondansetron (ZOFRAN) injection 4 mg (4 mg Intravenous Given 09/17/19 2008)     Initial Impression /  Assessment and Plan / ED Course  I have reviewed the triage vital signs and the nursing notes.  Pertinent labs & imaging results that were available during my care of the patient were reviewed by me and considered in my medical decision making (see chart for details).        MDM  Screen complete  MAJESTIC MOLONY was evaluated in Emergency Department on 09/17/2019 for the symptoms described in the history of present illness. She was evaluated in the context of the global COVID-19 pandemic, which necessitated consideration that the patient might be at risk for infection with the SARS-CoV-2 virus that causes COVID-19. Institutional protocols and algorithms that pertain to the evaluation of patients at risk for COVID-19 are in a state of rapid change based on information released by regulatory bodies including the CDC and federal and state organizations. These policies and algorithms were followed during the patient's care in the ED.  Patient is presenting for evaluation and treatment of left flank pain.  Patient was diagnosed with left-sided renal stone 2 days prior.  She returns today complaining of persistent pain.  She has not taken any ibuprofen or other anti-inflammatories.  Pain control with narcotics alone was not adequate.  Screening labs did not  reveal significant abnormality  Patient feels significant improved following IV fluids and IV Toradol administration.  She does understand need for close follow-up with urology.  Strict return precautions given and understood. Importance of close follow up repeatedly stressed.    Final Clinical Impressions(s) / ED Diagnoses   Final diagnoses:  Renal colic    ED Discharge Orders         Ordered    ibuprofen (ADVIL) 600 MG tablet  Every 6 hours PRN     09/17/19 2128    ondansetron (ZOFRAN) 4 MG tablet  Every 6 hours     09/17/19 2129           Wynetta Fines, MD 09/17/19 2132

## 2019-09-18 LAB — URINE CULTURE: Culture: NO GROWTH

## 2020-01-16 ENCOUNTER — Ambulatory Visit: Payer: PRIVATE HEALTH INSURANCE | Admitting: Registered Nurse

## 2020-01-17 ENCOUNTER — Encounter: Payer: Self-pay | Admitting: Registered Nurse

## 2020-01-17 ENCOUNTER — Other Ambulatory Visit: Payer: Self-pay

## 2020-01-17 ENCOUNTER — Ambulatory Visit (INDEPENDENT_AMBULATORY_CARE_PROVIDER_SITE_OTHER): Payer: BLUE CROSS/BLUE SHIELD | Admitting: Registered Nurse

## 2020-01-17 VITALS — BP 123/89 | HR 88 | Temp 98.3°F | Ht 63.0 in | Wt 169.4 lb

## 2020-01-17 DIAGNOSIS — R300 Dysuria: Secondary | ICD-10-CM

## 2020-01-17 DIAGNOSIS — D500 Iron deficiency anemia secondary to blood loss (chronic): Secondary | ICD-10-CM | POA: Diagnosis not present

## 2020-01-17 LAB — POCT URINALYSIS DIP (CLINITEK)
Bilirubin, UA: NEGATIVE
Blood, UA: NEGATIVE
Glucose, UA: NEGATIVE mg/dL
Ketones, POC UA: NEGATIVE mg/dL
Leukocytes, UA: NEGATIVE
Nitrite, UA: NEGATIVE
POC PROTEIN,UA: NEGATIVE
Spec Grav, UA: 1.025 (ref 1.010–1.025)
Urobilinogen, UA: 0.2 E.U./dL
pH, UA: 7.5 (ref 5.0–8.0)

## 2020-01-17 NOTE — Patient Instructions (Signed)
° ° ° °  If you have lab work done today you will be contacted with your lab results within the next 2 weeks.  If you have not heard from us then please contact us. The fastest way to get your results is to register for My Chart. ° ° °IF you received an x-ray today, you will receive an invoice from [facility]. Please contact [facility] at [phone] with questions or concerns regarding your invoice.  ° °IF you received labwork today, you will receive an invoice from [facility]. Please contact [facility] at [phone] with questions or concerns regarding your invoice.  ° °Our billing staff will not be able to assist you with questions regarding bills from these companies. ° °You will be contacted with the lab results as soon as they are available. The fastest way to get your results is to activate your My Chart account. Instructions are located on the last page of this paperwork. If you have not heard from us regarding the results in 2 weeks, please contact this office. °  ° ° ° °

## 2020-01-18 LAB — CBC WITH DIFFERENTIAL/PLATELET
Basophils Absolute: 0.1 10*3/uL (ref 0.0–0.2)
Basos: 1 %
EOS (ABSOLUTE): 0.1 10*3/uL (ref 0.0–0.4)
Eos: 1 %
Hematocrit: 37.9 % (ref 34.0–46.6)
Hemoglobin: 12.1 g/dL (ref 11.1–15.9)
Immature Grans (Abs): 0 10*3/uL (ref 0.0–0.1)
Immature Granulocytes: 0 %
Lymphocytes Absolute: 3.3 10*3/uL — ABNORMAL HIGH (ref 0.7–3.1)
Lymphs: 50 %
MCH: 26.4 pg — ABNORMAL LOW (ref 26.6–33.0)
MCHC: 31.9 g/dL (ref 31.5–35.7)
MCV: 83 fL (ref 79–97)
Monocytes Absolute: 0.4 10*3/uL (ref 0.1–0.9)
Monocytes: 6 %
Neutrophils Absolute: 2.7 10*3/uL (ref 1.4–7.0)
Neutrophils: 42 %
Platelets: 410 10*3/uL (ref 150–450)
RBC: 4.58 x10E6/uL (ref 3.77–5.28)
RDW: 14.9 % (ref 11.7–15.4)
WBC: 6.5 10*3/uL (ref 3.4–10.8)

## 2020-01-18 LAB — IRON,TIBC AND FERRITIN PANEL
Ferritin: 8 ng/mL — ABNORMAL LOW (ref 15–150)
Iron Saturation: 8 % — CL (ref 15–55)
Iron: 37 ug/dL (ref 27–159)
Total Iron Binding Capacity: 486 ug/dL — ABNORMAL HIGH (ref 250–450)
UIBC: 449 ug/dL — ABNORMAL HIGH (ref 131–425)

## 2020-01-23 NOTE — Progress Notes (Signed)
Hello!  If we could call Alexandria Jackson and let her know her iron is still significantly low and almost definitely contributing to her symptoms, that would be great. She should continue taking her iron supplements and follow up with her PCP, Janne Lab, in 6-8 weeks for repeat blood work.  Thank you,  Jari Sportsman, NP

## 2020-01-29 ENCOUNTER — Encounter: Payer: Self-pay | Admitting: Registered Nurse

## 2020-01-29 NOTE — Progress Notes (Signed)
Established Patient Office Visit  Subjective:  Patient ID: Alexandria Jackson, female    DOB: 09/27/1986  Age: 34 y.o. MRN: [id number]  CC:  Chief Complaint  Patient presents with  . Abdominal Cramping    patient states that she has had cycle two weeks ago and the cramping just wont seem go ease up per patient she has not taken anything for cramps   . Gynecologic Exam    HPI Alexandria Jackson presents for abdominal and pelvic cramping.  Has not taken anything for relief Lmp about 2 weeks ago Has been seen for this issue in the past by pcp Janne Lab, who suggested improved hydration, COCs, and iron supplementation for IDA, as well as OTCs for pain relief.  Pt states she has been hydrating more, not taking COCs, not taking Fe supplement  Past Medical History:  Diagnosis Date  . Allergy   . Anxiety   . Iron deficiency anemia 09/05/2019  . Menorrhagia 09/05/2019  . Thyroid disease     Past Surgical History:  Procedure Laterality Date  . TUBAL LIGATION      Family History  Problem Relation Age of Onset  . Cancer Mother     Social History   Socioeconomic History  . Marital status: Married    Spouse name: Not on file  . Number of children: Not on file  . Years of education: Not on file  . Highest education level: Not on file  Occupational History  . Not on file  Tobacco Use  . Smoking status: Never Smoker  . Smokeless tobacco: Never Used  Substance and Sexual Activity  . Alcohol use: Yes    Comment: occasion  . Drug use: No  . Sexual activity: Yes  Other Topics Concern  . Not on file  Social History Narrative  . Not on file   Social Determinants of Health   Financial Resource Strain:   . Difficulty of Paying Living Expenses:   Food Insecurity:   . Worried About Programme researcher, broadcasting/film/video in the Last Year:   . Barista in the Last Year:   Transportation Needs:   . Freight forwarder (Medical):   Marland Kitchen Lack of Transportation (Non-Medical):   Physical Activity:    . Days of Exercise per Week:   . Minutes of Exercise per Session:   Stress:   . Feeling of Stress :   Social Connections:   . Frequency of Communication with Friends and Family:   . Frequency of Social Gatherings with Friends and Family:   . Attends Religious Services:   . Active Member of Clubs or Organizations:   . Attends Banker Meetings:   Marland Kitchen Marital Status:   Intimate Partner Violence:   . Fear of Current or Ex-Partner:   . Emotionally Abused:   Marland Kitchen Physically Abused:   . Sexually Abused:     Outpatient Medications Prior to Visit  Medication Sig Dispense Refill  . cephALEXin (KEFLEX) 500 MG capsule Take 1 capsule (500 mg total) by mouth 4 (four) times daily. (Patient not taking: Reported on 01/17/2020) 28 capsule 0  . cetirizine (ZYRTEC) 10 MG tablet Take 1 tablet (10 mg total) by mouth daily. (Patient not taking: Reported on 09/17/2019) 30 tablet 0  . ferrous sulfate (FERROUSUL) 325 (65 FE) MG tablet Take 1 tablet (325 mg total) by mouth 2 (two) times daily with a meal. (Patient not taking: Reported on 09/17/2019) 60 tablet 3  . fexofenadine-pseudoephedrine (ALLEGRA-D)  60-120 MG 12 hr tablet Take 1 tablet by mouth 2 (two) times daily. (Patient not taking: Reported on 01/17/2020) 20 tablet 0  . ibuprofen (ADVIL) 600 MG tablet Take 1 tablet (600 mg total) by mouth every 6 (six) hours as needed. (Patient not taking: Reported on 01/17/2020) 30 tablet 0  . ibuprofen (ADVIL,MOTRIN) 600 MG tablet Take 1 tablet (600 mg total) by mouth every 6 (six) hours as needed. (Patient not taking: Reported on 01/17/2020) 30 tablet 0  . norethindrone-ethinyl estradiol (LOESTRIN FE 1/20) 1-20 MG-MCG tablet Take 1 tablet by mouth daily. (Patient not taking: Reported on 09/17/2019) 1 Package 11  . ondansetron (ZOFRAN ODT) 8 MG disintegrating tablet Take 1 tablet (8 mg total) by mouth every 8 (eight) hours as needed for nausea or vomiting. (Patient not taking: Reported on 09/17/2019) 20 tablet 0  .  ondansetron (ZOFRAN) 4 MG tablet Take 1 tablet (4 mg total) by mouth every 6 (six) hours. (Patient not taking: Reported on 01/17/2020) 12 tablet 0  . oxyCODONE-acetaminophen (PERCOCET/ROXICET) 5-325 MG tablet Take 1-2 tablets by mouth every 4 (four) hours as needed for severe pain. (Patient not taking: Reported on 01/17/2020) 20 tablet 0  . Vitamin D, Ergocalciferol, (DRISDOL) 1.25 MG (50000 UT) CAPS capsule Take 1 capsule (50,000 Units total) by mouth every 7 (seven) days. (Patient not taking: Reported on 01/17/2020) 5 capsule 2   No facility-administered medications prior to visit.    No Known Allergies  ROS Review of Systems  Constitutional: Negative.   HENT: Negative.   Eyes: Negative.   Respiratory: Negative.   Cardiovascular: Negative.   Gastrointestinal: Negative.   Endocrine: Negative.   Genitourinary: Negative.   Musculoskeletal: Negative.   Skin: Negative.   Allergic/Immunologic: Negative.   Hematological: Negative.   Psychiatric/Behavioral: Negative.   All other systems reviewed and are negative.     Objective:    Physical Exam  Constitutional: She is oriented to person, place, and time. She appears well-developed and well-nourished. No distress.  Cardiovascular: Normal rate, regular rhythm and normal heart sounds. Exam reveals no gallop and no friction rub.  No murmur heard. Pulmonary/Chest: Effort normal and breath sounds normal. No respiratory distress. She has no wheezes. She has no rales. She exhibits no tenderness.  Abdominal: Soft. Bowel sounds are normal. She exhibits no distension and no mass. There is no abdominal tenderness. There is no rebound and no guarding.  Neurological: She is alert and oriented to person, place, and time.  Skin: Skin is warm and dry. No rash noted. She is not diaphoretic. No erythema. No pallor.  Psychiatric: She has a normal mood and affect. Her behavior is normal. Judgment and thought content normal.  Nursing note and vitals  reviewed.   BP 123/89   Pulse 88   Temp 98.3 F (36.8 C) (Temporal)   Ht 5\' 3"  (1.6 m)   Wt 169 lb 6.4 oz (76.8 kg)   LMP 01/03/2020   SpO2 97%   BMI 30.01 kg/m  Wt Readings from Last 3 Encounters:  01/17/20 169 lb 6.4 oz (76.8 kg)  09/15/19 179 lb 14.3 oz (81.6 kg)  09/05/19 180 lb (81.6 kg)     Health Maintenance Due  Topic Date Due  . HIV Screening  Never done    There are no preventive care reminders to display for this patient.  Lab Results  Component Value Date   TSH 1.180 08/22/2019   Lab Results  Component Value Date   WBC 6.5 01/17/2020  HGB 12.1 01/17/2020   HCT 37.9 01/17/2020   MCV 83 01/17/2020   PLT 410 01/17/2020   Lab Results  Component Value Date   NA 138 09/17/2019   K 3.7 09/17/2019   CO2 23 09/17/2019   GLUCOSE 96 09/17/2019   BUN 12 09/17/2019   CREATININE 0.65 09/17/2019   BILITOT 0.5 09/15/2019   ALKPHOS 74 09/15/2019   AST 20 09/15/2019   ALT 9 09/15/2019   PROT 7.9 09/15/2019   ALBUMIN 3.7 09/15/2019   CALCIUM 9.0 09/17/2019   ANIONGAP 9 09/17/2019   No results found for: CHOL No results found for: HDL No results found for: LDLCALC No results found for: TRIG No results found for: CHOLHDL Lab Results  Component Value Date   HGBA1C 5.5 08/22/2019      Assessment & Plan:   Problem List Items Addressed This Visit      Other   Iron deficiency anemia (Chronic)   Relevant Orders   CBC with Differential (Completed)   Iron, TIBC and Ferritin Panel (Completed)    Other Visit Diagnoses    Dysuria    -  Primary   Relevant Orders   POCT URINALYSIS DIP (CLINITEK) (Completed)      No orders of the defined types were placed in this encounter.   Follow-up: No follow-ups on file.   PLAN  Unremarkable exam  poct ua indicates dehydration  Will draw labs to follow up with last visit  Suggest COCs may only need 2-3 mos to establish routine menses  Patient encouraged to call clinic with any questions, comments, or  concerns.  Maximiano Coss, NP

## 2020-06-28 ENCOUNTER — Other Ambulatory Visit: Payer: Self-pay | Admitting: Critical Care Medicine

## 2020-06-28 ENCOUNTER — Other Ambulatory Visit: Payer: Self-pay

## 2020-06-28 ENCOUNTER — Other Ambulatory Visit: Payer: PRIVATE HEALTH INSURANCE

## 2020-06-28 DIAGNOSIS — Z20822 Contact with and (suspected) exposure to covid-19: Secondary | ICD-10-CM | POA: Diagnosis not present

## 2020-06-30 LAB — NOVEL CORONAVIRUS, NAA: SARS-CoV-2, NAA: NOT DETECTED

## 2020-06-30 LAB — SARS-COV-2, NAA 2 DAY TAT

## 2020-12-17 ENCOUNTER — Other Ambulatory Visit: Payer: Self-pay

## 2020-12-17 ENCOUNTER — Ambulatory Visit (INDEPENDENT_AMBULATORY_CARE_PROVIDER_SITE_OTHER): Payer: BC Managed Care – PPO | Admitting: Family Medicine

## 2020-12-17 ENCOUNTER — Encounter: Payer: Self-pay | Admitting: Family Medicine

## 2020-12-17 VITALS — BP 123/83 | HR 88 | Temp 98.7°F | Ht 63.0 in | Wt 169.0 lb

## 2020-12-17 DIAGNOSIS — R5383 Other fatigue: Secondary | ICD-10-CM | POA: Diagnosis not present

## 2020-12-17 DIAGNOSIS — E559 Vitamin D deficiency, unspecified: Secondary | ICD-10-CM | POA: Diagnosis not present

## 2020-12-17 DIAGNOSIS — N898 Other specified noninflammatory disorders of vagina: Secondary | ICD-10-CM

## 2020-12-17 DIAGNOSIS — D500 Iron deficiency anemia secondary to blood loss (chronic): Secondary | ICD-10-CM

## 2020-12-17 DIAGNOSIS — E039 Hypothyroidism, unspecified: Secondary | ICD-10-CM

## 2020-12-17 DIAGNOSIS — N393 Stress incontinence (female) (male): Secondary | ICD-10-CM

## 2020-12-17 DIAGNOSIS — R519 Headache, unspecified: Secondary | ICD-10-CM

## 2020-12-17 MED ORDER — RIZATRIPTAN BENZOATE 5 MG PO TBDP: 5.0000 mg | ORAL_TABLET | ORAL | 2 refills | Status: DC | PRN

## 2020-12-17 NOTE — Patient Instructions (Addendum)
https://www.womenshealth.gov/menopause/menopause-basics"> https://www.clinicalkey.com">  Menopause Menopause is the normal time of a woman's life when menstrual periods stop completely. It marks the natural end to a woman's ability to become pregnant. It can be defined as the absence of a menstrual period for 12 months without another medical cause. The transition to menopause (perimenopause) most often happens between the ages of 45 and 55, and can last for many years. During perimenopause, hormone levels change in your body, which can cause symptoms and affect your health. Menopause may increase your risk for:  Weakened bones (osteoporosis), which causes fractures.  Depression.  Hardening and narrowing of the arteries (atherosclerosis), which can cause heart attacks and strokes. What are the causes? This condition is usually caused by a natural change in hormone levels that happens as you get older. The condition may also be caused by changes that are not natural, including:  Surgery to remove both ovaries (surgical menopause).  Side effects from some medicines, such as chemotherapy used to treat cancer (chemical menopause). What increases the risk? This condition is more likely to start at an earlier age if you have certain medical conditions or have undergone treatments, including:  A tumor of the pituitary gland in the brain.  A disease that affects the ovaries and hormones.  Certain cancer treatments, such as chemotherapy or hormone therapy, or radiation therapy on the pelvis.  Heavy smoking and excessive alcohol use.  Family history of early menopause. This condition is also more likely to develop earlier in women who are very thin. What are the signs or symptoms? Symptoms of this condition include:  Hot flashes.  Irregular menstrual periods.  Night sweats.  Changes in feelings about sex. This could be a decrease in sex drive or an increased discomfort around your  sexuality.  Vaginal dryness and thinning of the vaginal walls. This may cause painful sex.  Dryness of the skin and development of wrinkles.  Headaches.  Problems sleeping (insomnia).  Mood swings or irritability.  Memory problems.  Weight gain.  Hair growth on the face and chest.  Bladder infections or problems with urinating. How is this diagnosed? This condition is diagnosed based on your medical history, a physical exam, your age, your menstrual history, and your symptoms. Hormone tests may also be done. How is this treated? In some cases, no treatment is needed. You and your health care provider should make a decision together about whether treatment is necessary. Treatment will be based on your individual condition and preferences. Treatment for this condition focuses on managing symptoms. Treatment may include:  Menopausal hormone therapy (MHT).  Medicines to treat specific symptoms or complications.  Acupuncture.  Vitamin or herbal supplements. Before starting treatment, make sure to let your health care provider know if you have a personal or family history of these conditions:  Heart disease.  Breast cancer.  Blood clots.  Diabetes.  Osteoporosis. Follow these instructions at home: Lifestyle  Do not use any products that contain nicotine or tobacco, such as cigarettes, e-cigarettes, and chewing tobacco. If you need help quitting, ask your health care provider.  Get at least 30 minutes of physical activity on 5 or more days each week.  Avoid alcoholic and caffeinated beverages, as well as spicy foods. This may help prevent hot flashes.  Get 7-8 hours of sleep each night.  If you have hot flashes, try: ? Dressing in layers. ? Avoiding things that may trigger hot flashes, such as spicy food, warm places, or stress. ? Taking slow, deep   breaths when a hot flash starts. ? Keeping a fan in your home and office.  Find ways to manage stress, such as deep  breathing, meditation, or journaling.  Consider going to group therapy with other women who are having menopause symptoms. Ask your health care provider about recommended group therapy meetings. Eating and drinking  Eat a healthy, balanced diet that contains whole grains, lean protein, low-fat dairy, and plenty of fruits and vegetables.  Your health care provider may recommend adding more soy to your diet. Foods that contain soy include tofu, tempeh, and soy milk.  Eat plenty of foods that contain calcium and vitamin D for bone health. Items that are rich in calcium include low-fat milk, yogurt, beans, almonds, sardines, broccoli, and kale.   Medicines  Take over-the-counter and prescription medicines only as told by your health care provider.  Talk with your health care provider before starting any herbal supplements. If prescribed, take vitamins and supplements as told by your health care provider. General instructions  Keep track of your menstrual periods, including: ? When they occur. ? How heavy they are and how long they last. ? How much time passes between periods.  Keep track of your symptoms, noting when they start, how often you have them, and how long they last.  Use vaginal lubricants or moisturizers to help with vaginal dryness and improve comfort during sex.  Keep all follow-up visits. This is important. This includes any group therapy or counseling.   Contact a health care provider if:  You are still having menstrual periods after age 33.  You have pain during sex.  You have not had a period for 12 months and you develop vaginal bleeding. Get help right away if you have:  Severe depression.  Excessive vaginal bleeding.  Pain when you urinate.  A fast or irregular heartbeat (palpitations).  Severe headaches.  Abdominal pain or severe indigestion. Summary  Menopause is a normal time of life when menstrual periods stop completely. It is usually defined as  the absence of a menstrual period for 12 months without another medical cause.  The transition to menopause (perimenopause) most often happens between the ages of 56 and 16 and can last for several years.  Symptoms can be managed through medicines, lifestyle changes, and complementary therapies such as acupuncture.  Eat a balanced diet that is rich in nutrients to promote bone health and heart health and to manage symptoms during menopause. This information is not intended to replace advice given to you by your health care provider. Make sure you discuss any questions you have with your health care provider. Document Revised: 07/19/2020 Document Reviewed: 04/04/2020 Elsevier Patient Education  2021 Elsevier Inc.    Migraine Headache A migraine headache is a very strong throbbing pain on one side or both sides of your head. This type of headache can also cause other symptoms. It can last from 4 hours to 3 days. Talk with your doctor about what things may bring on (trigger) this condition. What are the causes? The exact cause of this condition is not known. This condition may be triggered or caused by:  Drinking alcohol.  Smoking.  Taking medicines, such as: ? Medicine used to treat chest pain (nitroglycerin). ? Birth control pills. ? Estrogen. ? Some blood pressure medicines.  Eating or drinking certain products.  Doing physical activity. Other things that may trigger a migraine headache include:  Having a menstrual period.  Pregnancy.  Hunger.  Stress.  Not getting enough  sleep or getting too much sleep.  Weather changes.  Tiredness (fatigue). What increases the risk?  Being 30-81 years old.  Being female.  Having a family history of migraine headaches.  Being Caucasian.  Having depression or anxiety.  Being very overweight. What are the signs or symptoms?  A throbbing pain. This pain may: ? Happen in any area of the head, such as on one side or both  sides. ? Make it hard to do daily activities. ? Get worse with physical activity. ? Get worse around bright lights or loud noises.  Other symptoms may include: ? Feeling sick to your stomach (nauseous). ? Vomiting. ? Dizziness. ? Being sensitive to bright lights, loud noises, or smells.  Before you get a migraine headache, you may get warning signs (an aura). An aura may include: ? Seeing flashing lights or having blind spots. ? Seeing bright spots, halos, or zigzag lines. ? Having tunnel vision or blurred vision. ? Having numbness or a tingling feeling. ? Having trouble talking. ? Having weak muscles.  Some people have symptoms after a migraine headache (postdromal phase), such as: ? Tiredness. ? Trouble thinking (concentrating). How is this treated?  Taking medicines that: ? Relieve pain. ? Relieve the feeling of being sick to your stomach. ? Prevent migraine headaches.  Treatment may also include: ? Having acupuncture. ? Avoiding foods that bring on migraine headaches. ? Learning ways to control your body functions (biofeedback). ? Therapy to help you know and deal with negative thoughts (cognitive behavioral therapy). Follow these instructions at home: Medicines  Take over-the-counter and prescription medicines only as told by your doctor.  Ask your doctor if the medicine prescribed to you: ? Requires you to avoid driving or using heavy machinery. ? Can cause trouble pooping (constipation). You may need to take these steps to prevent or treat trouble pooping:  Drink enough fluid to keep your pee (urine) pale yellow.  Take over-the-counter or prescription medicines.  Eat foods that are high in fiber. These include beans, whole grains, and fresh fruits and vegetables.  Limit foods that are high in fat and sugar. These include fried or sweet foods. Lifestyle  Do not drink alcohol.  Do not use any products that contain nicotine or tobacco, such as cigarettes,  e-cigarettes, and chewing tobacco. If you need help quitting, ask your doctor.  Get at least 8 hours of sleep every night.  Limit and deal with stress. General instructions  Keep a journal to find out what may bring on your migraine headaches. For example, write down: ? What you eat and drink. ? How much sleep you get. ? Any change in what you eat or drink. ? Any change in your medicines.  If you have a migraine headache: ? Avoid things that make your symptoms worse, such as bright lights. ? It may help to lie down in a dark, quiet room. ? Do not drive or use heavy machinery. ? Ask your doctor what activities are safe for you.  Keep all follow-up visits as told by your doctor. This is important.      Contact a doctor if:  You get a migraine headache that is different or worse than others you have had.  You have more than 15 headache days in one month. Get help right away if:  Your migraine headache gets very bad.  Your migraine headache lasts longer than 72 hours.  You have a fever.  You have a stiff neck.  You have trouble  seeing.  Your muscles feel weak or like you cannot control them.  You start to lose your balance a lot.  You start to have trouble walking.  You pass out (faint).  You have a seizure. Summary  A migraine headache is a very strong throbbing pain on one side or both sides of your head. These headaches can also cause other symptoms.  This condition may be treated with medicines and changes to your lifestyle.  Keep a journal to find out what may bring on your migraine headaches.  Contact a doctor if you get a migraine headache that is different or worse than others you have had.  Contact your doctor if you have more than 15 headache days in a month. This information is not intended to replace advice given to you by your health care provider. Make sure you discuss any questions you have with your health care provider. Document Revised:  02/10/2019 Document Reviewed: 12/01/2018 Elsevier Patient Education  2021 [employer].   If you have lab work done today you will be contacted with your lab results within the next 2 weeks.  If you have not heard from [place] then please contact us. The fastest way to get your results is to register for My Chart.   IF you received an x-ray today, you will receive an invoice from [facility]. Please contact [facility] at [phone] with questions or concerns regarding your invoice.   IF you received labwork today, you will receive an invoice from [facility]. Please contact [facility] at [phone] with questions or concerns regarding your invoice.   Our billing staff will not be able to assist you with questions regarding bills from these companies.  You will be contacted with the lab results as soon as they are available. The fastest way to get your results is to activate your My Chart account. Instructions are located on the last page of this paperwork. If you have not heard from [place] regarding the results in 2 weeks, please contact this office.

## 2020-12-17 NOTE — Progress Notes (Signed)
2/15/202211:19 AM  Alexandria Jackson 1986-08-29, 35 y.o., female [id number]  Chief Complaint  Patient presents with  . Migraine    Worsened in last 2 months   . Fatigue    With exertion heart racing and feeling weak / tests weekly at work for covid    HPI:   Patient is a 35 y.o. female with past medical history significant for allergies, IDA who presents today for headaches and fatigue   Alexandria Jackson is a 35 y.o. female who complains of migraine headache for 1 month(s). She has a well established history of recurrent migraines.  Description of pain: throbbing pain, unilateral in the left occipital area. Associated symptoms: dizziness and light sensitivity. Patient has already taken Ibuprofen 800 mg bid for this headache without relief. Has never been on prescription medications.   There are no associated abnormal neurological symptoms such as TIA's, loss of balance, loss of vision or speech, numbness or weakness on review. Past neurological history: negative for stroke, MS, epilepsy, or brain tumor.   OBJECTIVE:  Patient appears in pain, preferring to lie in a darkened room. Her vitals are normal. Alert and oriented x 3.  Ears and throat normal. Neck fully supple without nodes. Sinuses non tender. Cranial nerves are normal.  Fundi are normal with sharp disc margins, no papilledema, hemorrhages or exudates noted. DTR's normal and symmetric. Babinski sign absent.  Mental status normal. Cerebellar function normal.    Had chills at work the other day and left hand two fingers went numb Got better without intervention  Not taking OCP, regular periods Mother went thru menopause age 48 Starting to have issues with vaginal dryness Also having issues with stress incontinence    Depression screen [facility] 2/9 12/17/2020 01/17/2020 09/05/2019  Decreased Interest 0 0 0  Down, Depressed, Hopeless 0 0 0  PHQ - 2 Score 0 0 0    Fall Risk  12/17/2020 01/17/2020 09/05/2019 08/22/2019  Falls in  the past year? 0 0 0 0  Number falls in past yr: 0 0 0 0  Injury with Fall? 0 0 - 0  Follow up Falls evaluation completed Falls evaluation completed - -     No Known Allergies  Prior to Admission medications   Medication Sig Start Date End Date Taking? Authorizing Provider  ibuprofen (ADVIL) 200 MG tablet Take 200 mg by mouth every 6 (six) hours as needed.   Yes [provider]  cetirizine (ZYRTEC) 10 MG tablet Take 1 tablet (10 mg total) by mouth daily. Patient not taking: No sig reported 02/10/16   Cuthriell, Charline Bills, PA-C  ferrous sulfate (FERROUSUL) 325 (65 FE) MG tablet Take 1 tablet (325 mg total) by mouth 2 (two) times daily with a meal. Patient not taking: Reported on 09/17/2019 09/05/19 10/05/19  Wendall Mola, NP  fexofenadine-pseudoephedrine (ALLEGRA-D) 60-120 MG 12 hr tablet Take 1 tablet by mouth 2 (two) times daily. Patient not taking: No sig reported 04/03/17   Sable Feil, PA-C  norethindrone-ethinyl estradiol (LOESTRIN FE 1/20) 1-20 MG-MCG tablet Take 1 tablet by mouth daily. Patient not taking: No sig reported 09/05/19   Wendall Mola, NP    Past Medical History:  Diagnosis Date  . Allergy   . Anxiety   . Iron deficiency anemia 09/05/2019  . Menorrhagia 09/05/2019  . Thyroid disease     Past Surgical History:  Procedure Laterality Date  . TUBAL LIGATION      Social History   Tobacco Use  .  Smoking status: Never Smoker  . Smokeless tobacco: Never Used  Substance Use Topics  . Alcohol use: Yes    Comment: occasion    Family History  Problem Relation Age of Onset  . Cancer Mother     Review of Systems  Constitutional: Positive for malaise/fatigue. Negative for chills and fever.  Eyes: Negative for blurred vision and double vision.  Respiratory: Negative for cough, shortness of breath and wheezing.   Cardiovascular: Negative for chest pain, palpitations and leg swelling.  Gastrointestinal: Negative for abdominal pain,  constipation, diarrhea, heartburn, nausea and vomiting.  Genitourinary: Negative for dysuria, frequency, hematuria and urgency.  Musculoskeletal: Negative for back pain and joint pain.  Skin: Negative for rash.  Neurological: Positive for headaches. Negative for dizziness and weakness.     OBJECTIVE:  Today's Vitals   12/17/20 1049  BP: 123/83  Pulse: 88  Temp: 98.7 F (37.1 C)  SpO2: 100%  Weight: 169 lb (76.7 kg)  Height: 5' 3"  (1.6 m)   Body mass index is 29.94 kg/m.   Physical Exam Constitutional:      General: She is not in acute distress.    Appearance: Normal appearance. She is not ill-appearing.  HENT:     Head: Normocephalic.  Cardiovascular:     Rate and Rhythm: Normal rate and regular rhythm.     Pulses: Normal pulses.     Heart sounds: Normal heart sounds. No murmur heard. No friction rub. No gallop.   Pulmonary:     Effort: Pulmonary effort is normal. No respiratory distress.     Breath sounds: Normal breath sounds. No stridor. No wheezing, rhonchi or rales.  Abdominal:     General: Bowel sounds are normal.     Palpations: Abdomen is soft.     Tenderness: There is no abdominal tenderness.  Musculoskeletal:     Right lower leg: No edema.     Left lower leg: No edema.  Skin:    General: Skin is warm and dry.  Neurological:     Mental Status: She is alert and oriented to person, place, and time.  Psychiatric:        Mood and Affect: Mood normal.        Behavior: Behavior normal.     No results found for this or any previous visit (from the past 24 hour(s)).  No results found.   ASSESSMENT and PLAN  Problem List Items Addressed This Visit      Endocrine   Acquired hypothyroidism - Primary   Relevant Orders   TSH     Other   Fatigue (Chronic)   Relevant Orders   HIV Antibody (routine testing w rflx)   Hepatitis C antibody   CMP14+EGFR   Hemoglobin A1c   Lipid Panel   Iron deficiency anemia (Chronic)   Relevant Orders   Iron, TIBC  and Ferritin Panel   CBC with Differential   Headache, unspecified headache type   Relevant Medications   ibuprofen (ADVIL) 200 MG tablet   rizatriptan (MAXALT-MLT) 5 MG disintegrating tablet   Vitamin D insufficiency   Relevant Orders   Vitamin D, 25-hydroxy    Other Visit Diagnoses    Stress incontinence       Stress incontinence in female       Relevant Orders   Ambulatory referral to Physical Therapy   Vaginal dryness           Plan  Treatment today -  see orders as documented in the electronic  medical record. ROV prn if pain does not resolve after treatment. Referral placed for PT Will follow up with lab results Education provided on vaginal dryness and menopause   Return in about 3 months (around 03/16/2021).    Huston Foley Alexandria Mccandlish, FNP-BC [facility] [place], [place] [address] Ph.  [phone] Fax [phone]

## 2020-12-18 ENCOUNTER — Other Ambulatory Visit: Payer: Self-pay | Admitting: Family Medicine

## 2020-12-18 DIAGNOSIS — E559 Vitamin D deficiency, unspecified: Secondary | ICD-10-CM

## 2020-12-18 DIAGNOSIS — D500 Iron deficiency anemia secondary to blood loss (chronic): Secondary | ICD-10-CM

## 2020-12-18 LAB — CBC WITH DIFFERENTIAL/PLATELET
Basophils Absolute: 0.1 10*3/uL (ref 0.0–0.2)
Basos: 1 %
EOS (ABSOLUTE): 0.1 10*3/uL (ref 0.0–0.4)
Eos: 1 %
Hematocrit: 30.4 % — ABNORMAL LOW (ref 34.0–46.6)
Hemoglobin: 9.1 g/dL — ABNORMAL LOW (ref 11.1–15.9)
Immature Grans (Abs): 0 10*3/uL (ref 0.0–0.1)
Immature Granulocytes: 0 %
Lymphocytes Absolute: 2.4 10*3/uL (ref 0.7–3.1)
Lymphs: 49 %
MCH: 22.1 pg — ABNORMAL LOW (ref 26.6–33.0)
MCHC: 29.9 g/dL — ABNORMAL LOW (ref 31.5–35.7)
MCV: 74 fL — ABNORMAL LOW (ref 79–97)
Monocytes Absolute: 0.3 10*3/uL (ref 0.1–0.9)
Monocytes: 7 %
Neutrophils Absolute: 2 10*3/uL (ref 1.4–7.0)
Neutrophils: 42 %
Platelets: 392 10*3/uL (ref 150–450)
RBC: 4.11 x10E6/uL (ref 3.77–5.28)
RDW: 17.7 % — ABNORMAL HIGH (ref 11.7–15.4)
WBC: 4.8 10*3/uL (ref 3.4–10.8)

## 2020-12-18 LAB — HEMOGLOBIN A1C
Est. average glucose Bld gHb Est-mCnc: 105 mg/dL
Hgb A1c MFr Bld: 5.3 % (ref 4.8–5.6)

## 2020-12-18 LAB — CMP14+EGFR
ALT: 19 IU/L (ref 0–32)
AST: 33 IU/L (ref 0–40)
Albumin/Globulin Ratio: 1.3 (ref 1.2–2.2)
Albumin: 4.3 g/dL (ref 3.8–4.8)
Alkaline Phosphatase: 105 IU/L (ref 44–121)
BUN/Creatinine Ratio: 14 (ref 9–23)
BUN: 9 mg/dL (ref 6–20)
Bilirubin Total: 0.5 mg/dL (ref 0.0–1.2)
CO2: 19 mmol/L — ABNORMAL LOW (ref 20–29)
Calcium: 9.2 mg/dL (ref 8.7–10.2)
Chloride: 105 mmol/L (ref 96–106)
Creatinine, Ser: 0.66 mg/dL (ref 0.57–1.00)
GFR calc Af Amer: 131 mL/min/{1.73_m2} (ref >59)
GFR calc non Af Amer: 114 mL/min/{1.73_m2} (ref >59)
Globulin, Total: 3.3 g/dL (ref 1.5–4.5)
Glucose: 115 mg/dL — ABNORMAL HIGH (ref 65–99)
Potassium: 4.8 mmol/L (ref 3.5–5.2)
Sodium: 140 mmol/L (ref 134–144)
Total Protein: 7.6 g/dL (ref 6.0–8.5)

## 2020-12-18 LAB — IRON,TIBC AND FERRITIN PANEL
Ferritin: 3 ng/mL — ABNORMAL LOW (ref 15–150)
Iron Saturation: 6 % — CL (ref 15–55)
Iron: 28 ug/dL (ref 27–159)
Total Iron Binding Capacity: 491 ug/dL — ABNORMAL HIGH (ref 250–450)
UIBC: 463 ug/dL — ABNORMAL HIGH (ref 131–425)

## 2020-12-18 LAB — LIPID PANEL
Chol/HDL Ratio: 3.7 ratio (ref 0.0–4.4)
Cholesterol, Total: 180 mg/dL (ref 100–199)
HDL: 49 mg/dL (ref >39)
LDL Chol Calc (NIH): 112 mg/dL — ABNORMAL HIGH (ref 0–99)
Triglycerides: 107 mg/dL (ref 0–149)
VLDL Cholesterol Cal: 19 mg/dL (ref 5–40)

## 2020-12-18 LAB — TSH: TSH: 0.58 u[IU]/mL (ref 0.450–4.500)

## 2020-12-18 LAB — VITAMIN D 25 HYDROXY (VIT D DEFICIENCY, FRACTURES): Vit D, 25-Hydroxy: 9.3 ng/mL — ABNORMAL LOW (ref 30.0–100.0)

## 2020-12-18 LAB — HIV ANTIBODY (ROUTINE TESTING W REFLEX): HIV Screen 4th Generation wRfx: NONREACTIVE

## 2020-12-18 LAB — HEPATITIS C ANTIBODY: Hep C Virus Ab: <0.1 s/co ratio (ref 0.0–0.9)

## 2020-12-18 MED ORDER — VITAMIN D (ERGOCALCIFEROL) 1.25 MG (50000 UNIT) PO CAPS: 50000.0000 [IU] | ORAL_CAPSULE | ORAL | 6 refills | Status: DC

## 2020-12-18 MED ORDER — FERROUS SULFATE 325 (65 FE) MG PO TABS: 325.0000 mg | ORAL_TABLET | Freq: Two times a day (BID) | ORAL | 3 refills | Status: DC

## 2020-12-31 ENCOUNTER — Encounter: Payer: Self-pay | Admitting: Family Medicine

## 2021-03-07 IMAGING — US US TRANSVAGINAL NON-OB
1 series · 13 of 25 positions shown · non-contrast
Comparison: None.

CLINICAL DATA: Pelvic pain beginning today.

EXAM:
TRANSABDOMINAL AND TRANSVAGINAL ULTRASOUND OF PELVIS
DOPPLER ULTRASOUND OF OVARIES
TECHNIQUE: Both transabdominal and transvaginal ultrasound examinations of the
pelvis were performed. Transabdominal technique was performed for
global imaging of the pelvis including uterus, ovaries, adnexal
regions, and pelvic cul-de-sac.
It was necessary to proceed with endovaginal exam following the
transabdominal exam to visualize the adnexa and endometrium. Color
and duplex Doppler ultrasound was utilized to evaluate blood flow to
the ovaries.

[Series 1: us transvaginal non-ob · 13 of 133 slices shown]
[im 1/133]
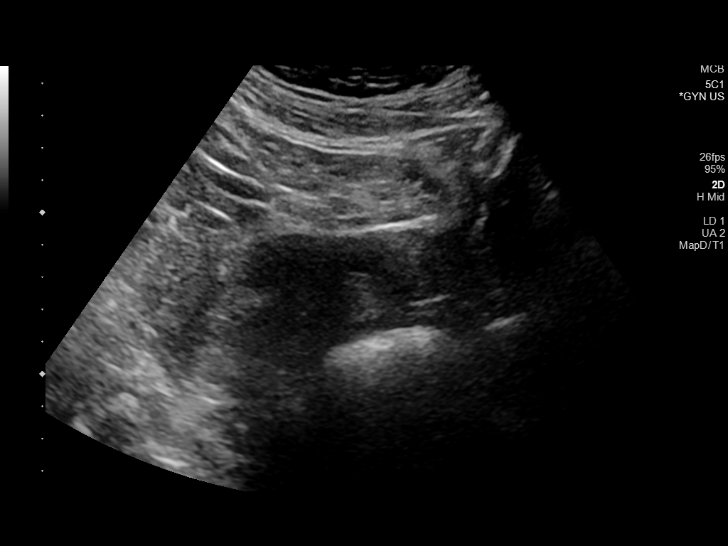
[im 12/133]
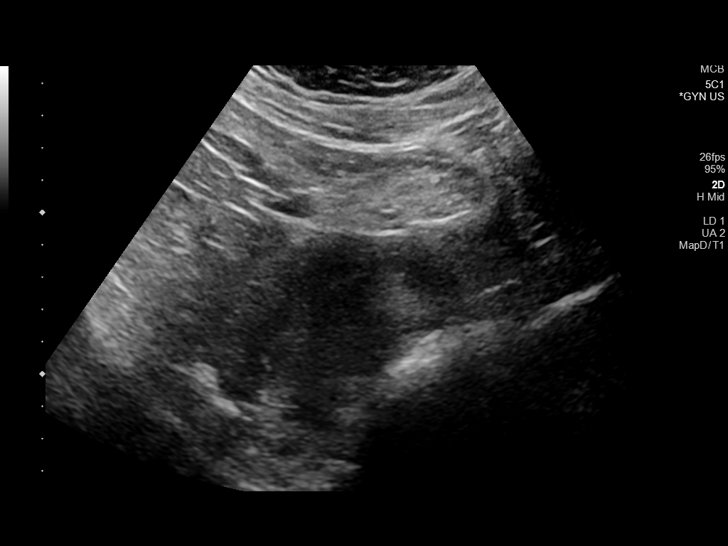
[im 23/133]
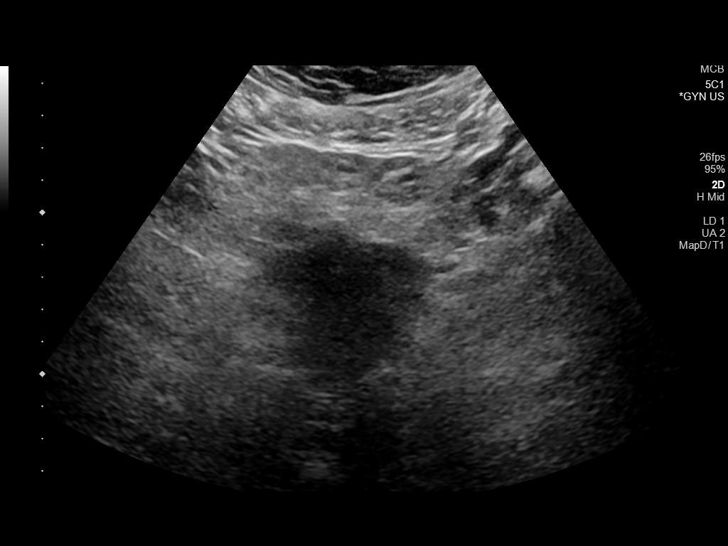
[im 34/133]
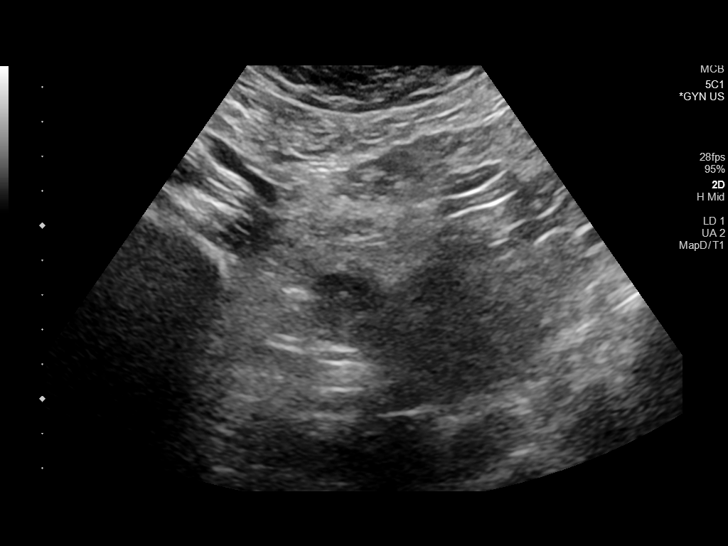
[im 45/133]
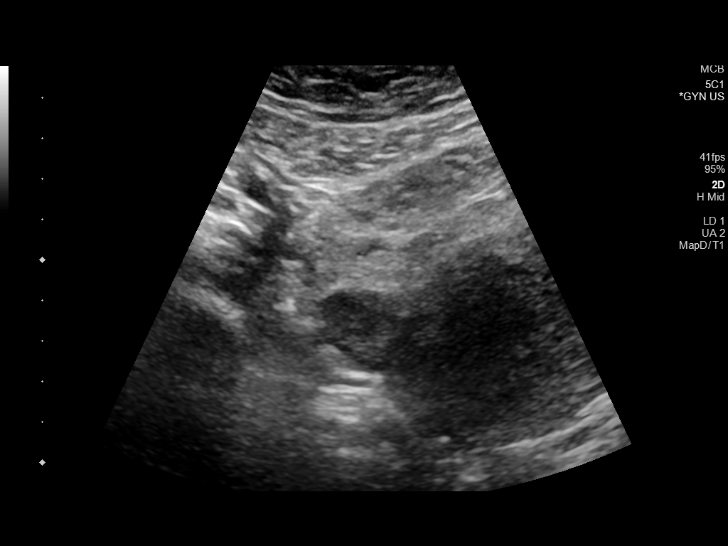
[im 56/133]
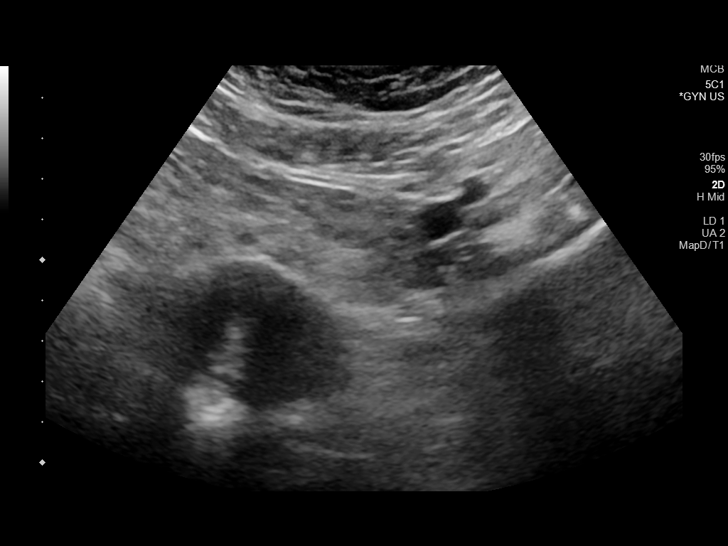
[im 67/133]
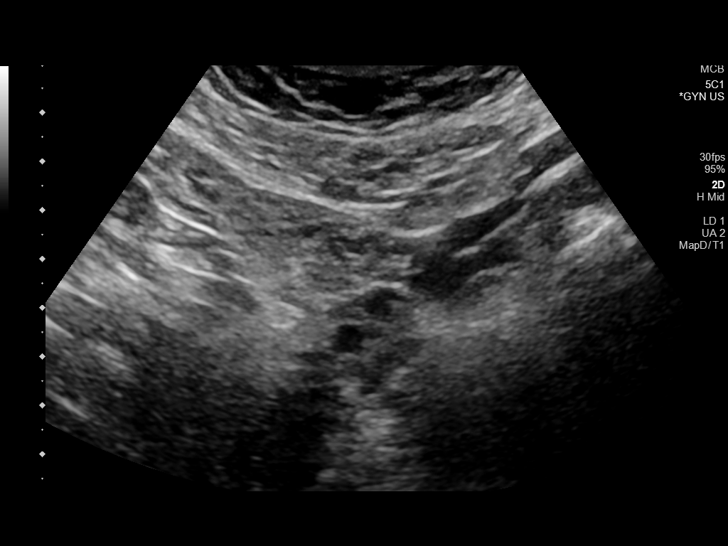
[im 78/133]
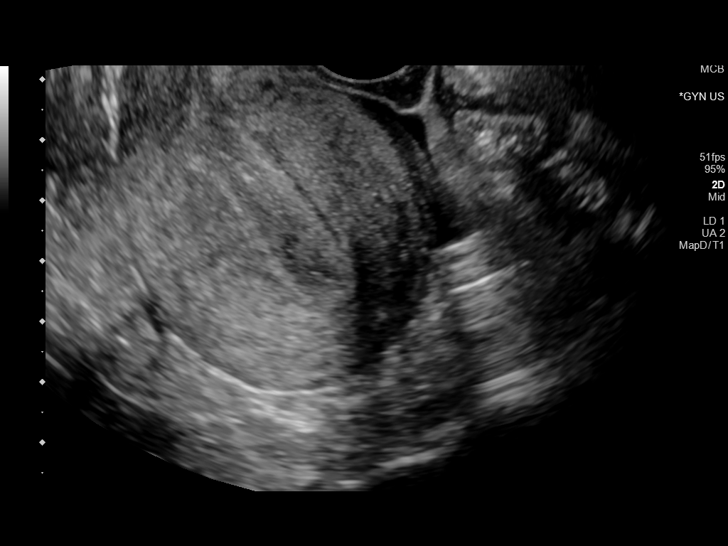
[im 89/133]
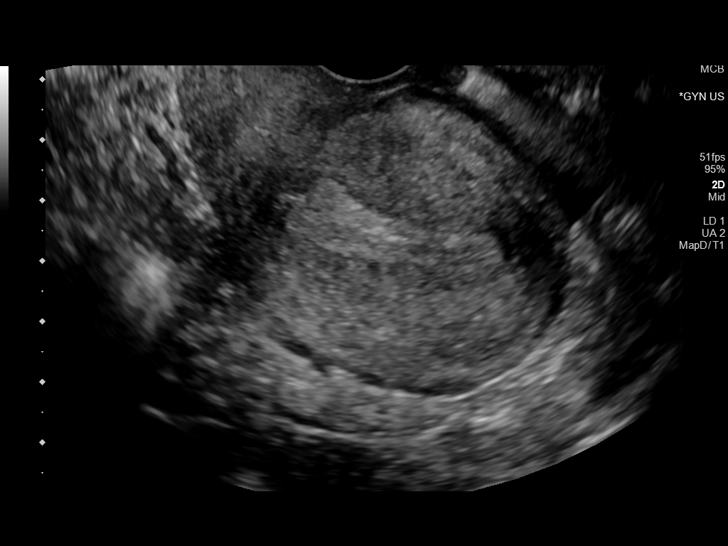
[im 100/133]
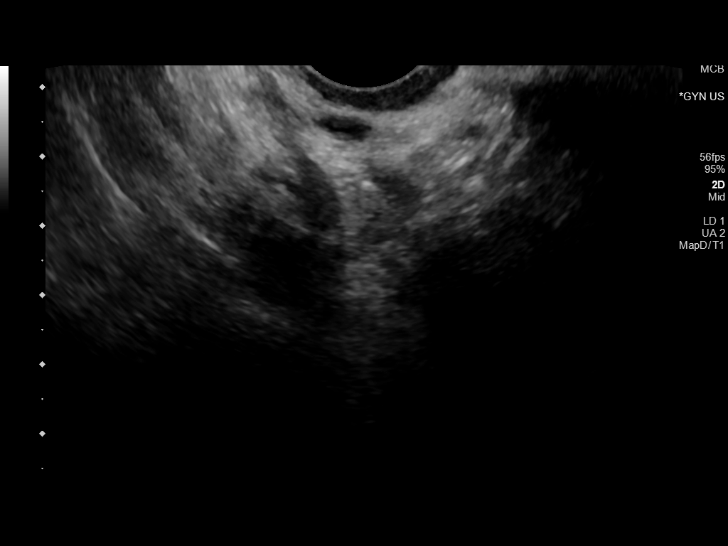
[im 111/133]
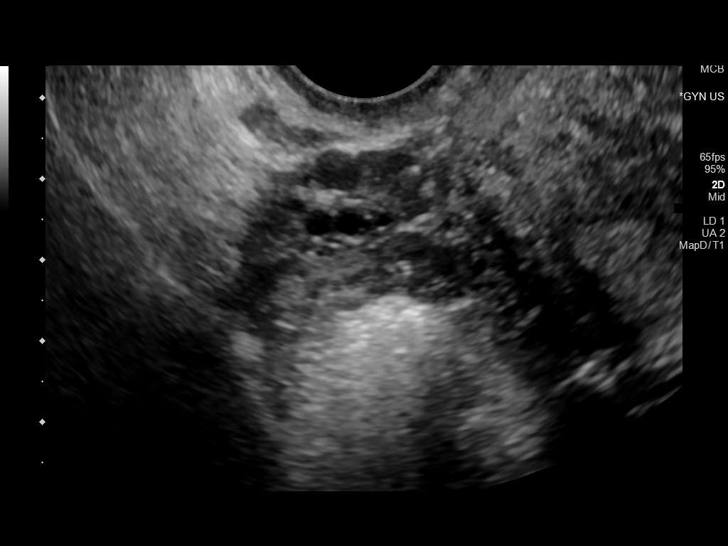
[im 122/133]
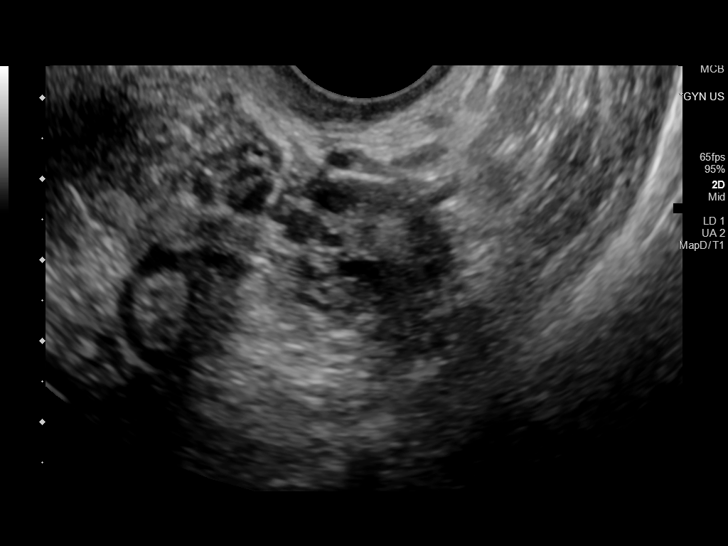
[im 133/133]
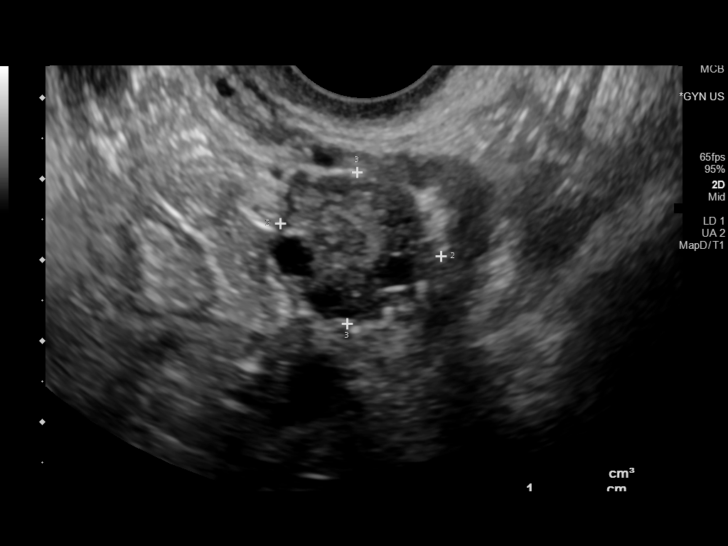

[13 of 25 positions shown; findings below may reference images not displayed]

FINDINGS: Uterus

Measurements: 11.9 x 5.2 x 5.1 cm = volume: 166 mL. No fibroids or
other mass visualized.

Endometrium

Thickness: 13 mm, within normal limits. No focal abnormality
visualized.

Right ovary

Measurements: 2.8 x 1.6 x 2.4 cm = volume: 5.4 mL. Normal
appearance/no adnexal mass.

Left ovary

Measurements: 2.0 x 1.9 x 2.0 cm = volume: 3.9 mL. Normal
appearance/no adnexal mass.

Pulsed Doppler evaluation of both ovaries demonstrates normal
low-resistance arterial and venous waveforms.

Other findings

Trace free fluid is likely physiologic
IMPRESSION: 1. Normal sonographic appearance of the uterus and adnexa.
2. Normal color and Doppler analysis of the adnexa.

## 2021-03-07 IMAGING — CT CT ABD-PELV W/ CM
2 of 4 series · 17 of 46 positions shown, 19 images · IV contrast (OMNIPAQUE 300)
Comparison: None.

CLINICAL DATA: Abdominal distension

EXAM:
CT ABDOMEN AND PELVIS WITH CONTRAST
TECHNIQUE: Multidetector CT imaging of the abdomen and pelvis was performed
using the standard protocol following bolus administration of
intravenous contrast.
CONTRAST:  100mL OMNIPAQUE IOHEXOL 300 MG/ML  SOLN

[Series 2: axial st · axial · 0.72mm/px · z∈[-538,-123]mm · 14 of 93 slices shown, 16 images]
[im 5/93  soft-tissue]
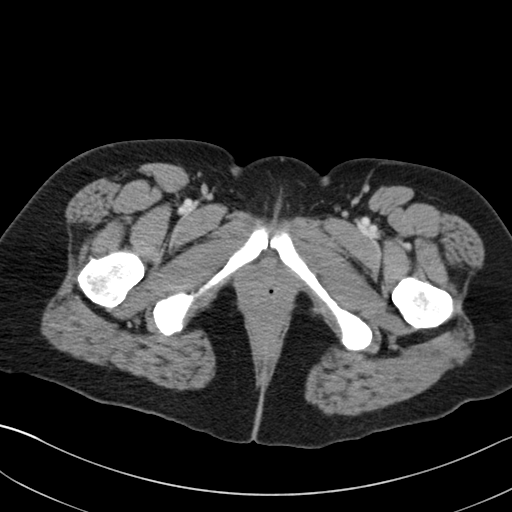
[im 5/93  bone]
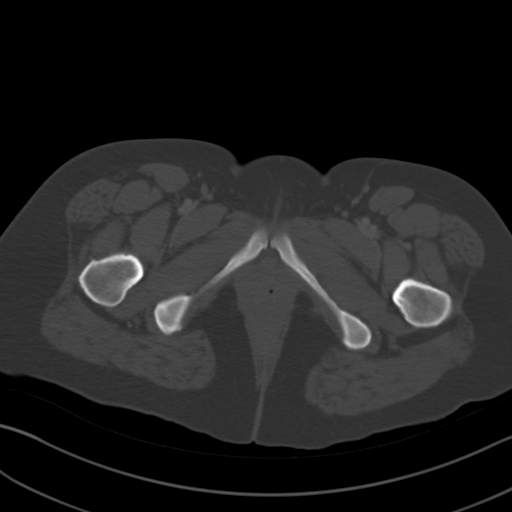
[im 10/93  soft-tissue]
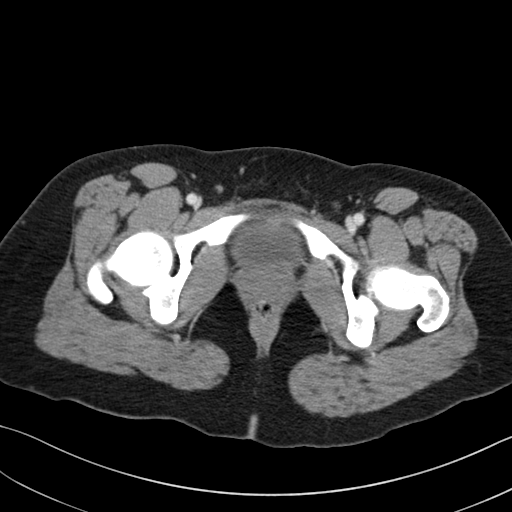
[im 20/93  soft-tissue]
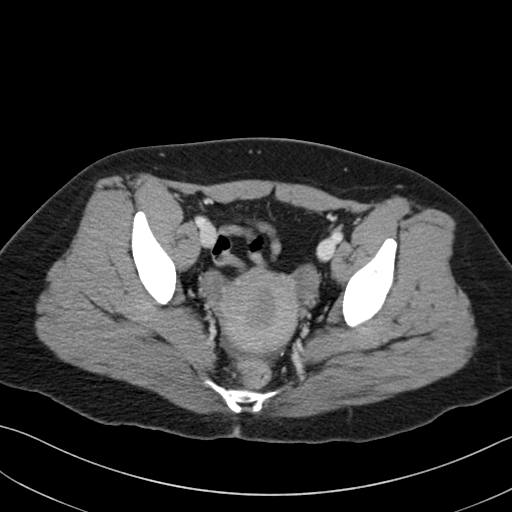
[im 25/93  soft-tissue]
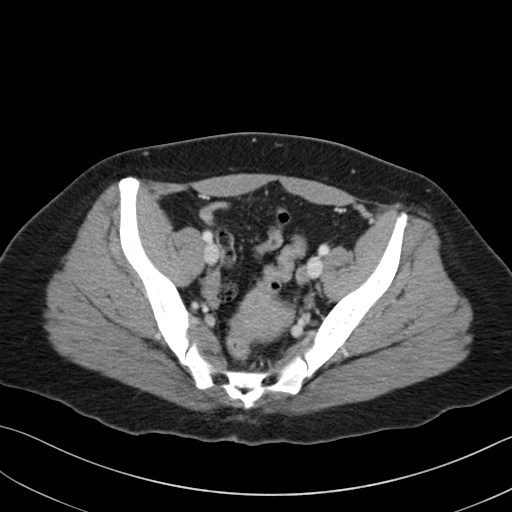
[im 30/93  soft-tissue]
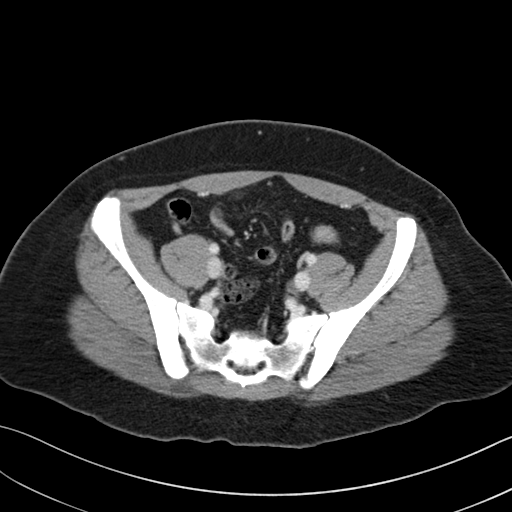
[im 39/93  soft-tissue]
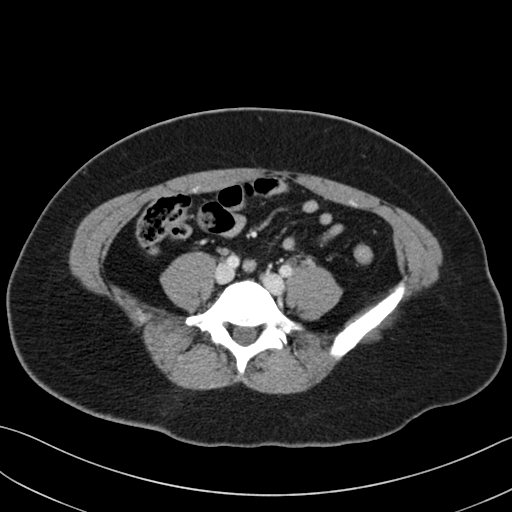
[im 44/93  soft-tissue]
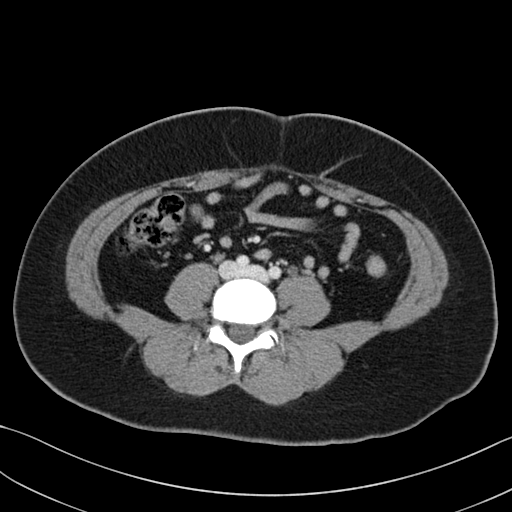
[im 49/93  soft-tissue]
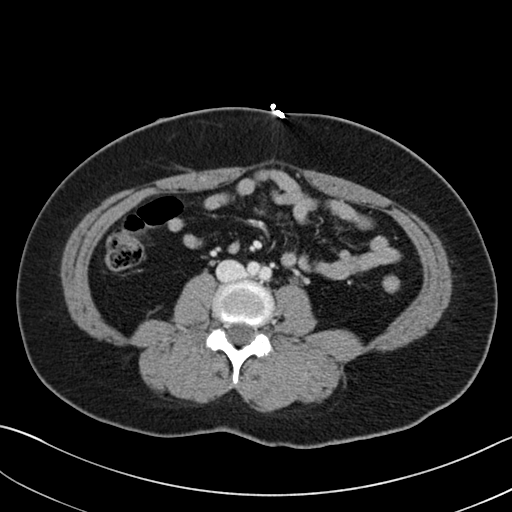
[im 54/93  soft-tissue]
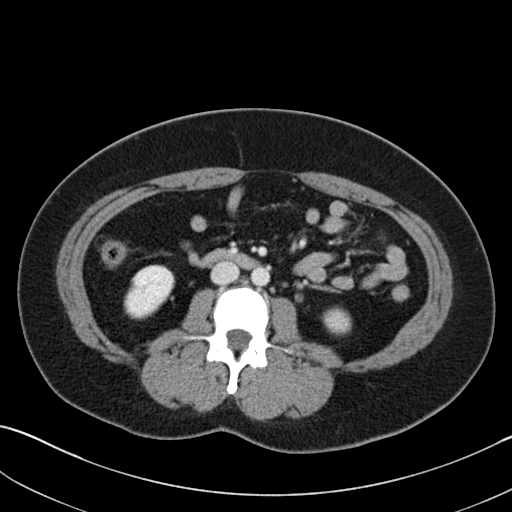
[im 54/93  bone]
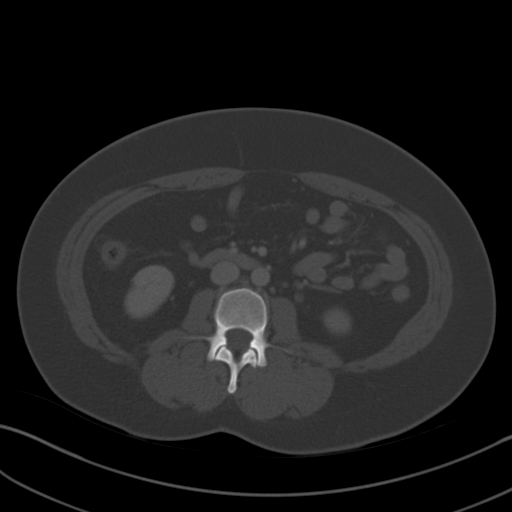
[im 63/93  soft-tissue]
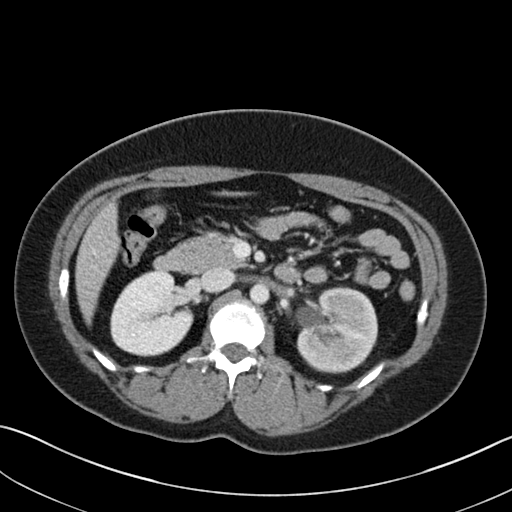
[im 68/93  soft-tissue]
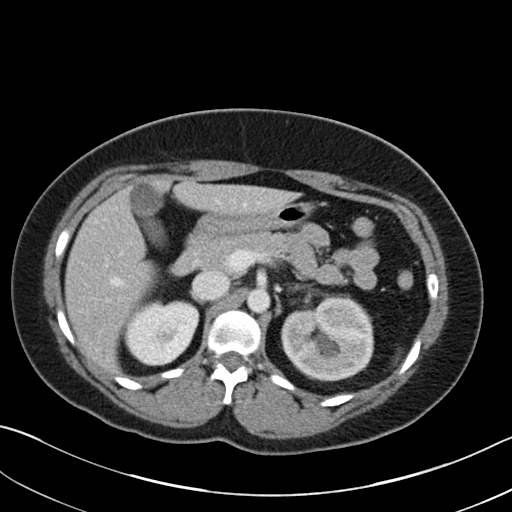
[im 73/93  soft-tissue]
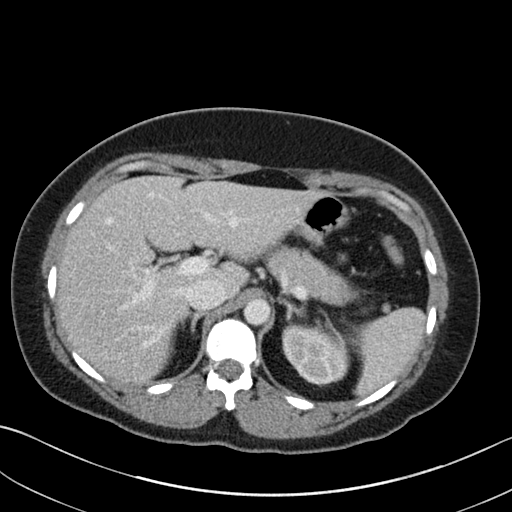
[im 83/93  soft-tissue]
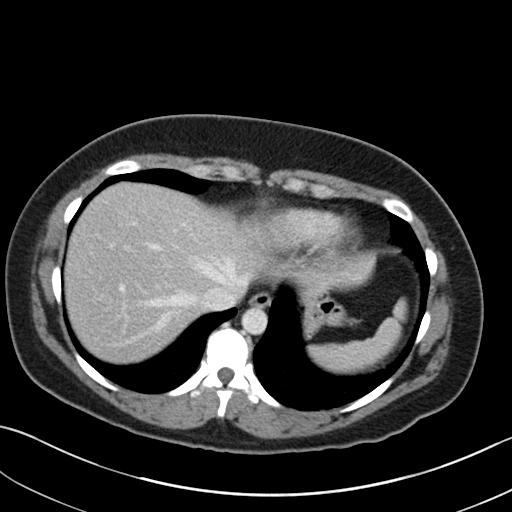
[im 88/93  soft-tissue]
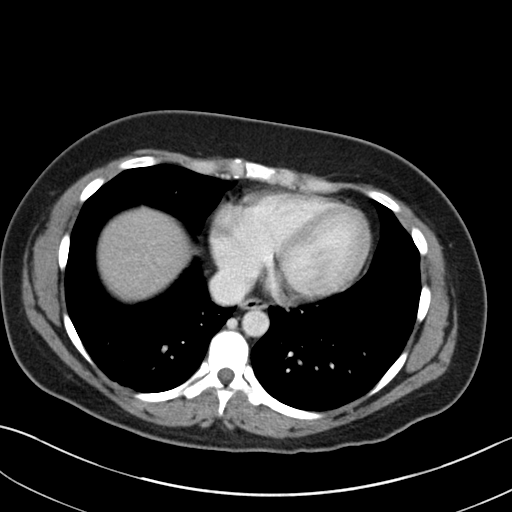

[Series 4: coronal st · coronal · 0.81mm/px · 3 of 98 slices shown]
[im 33/98  soft-tissue]
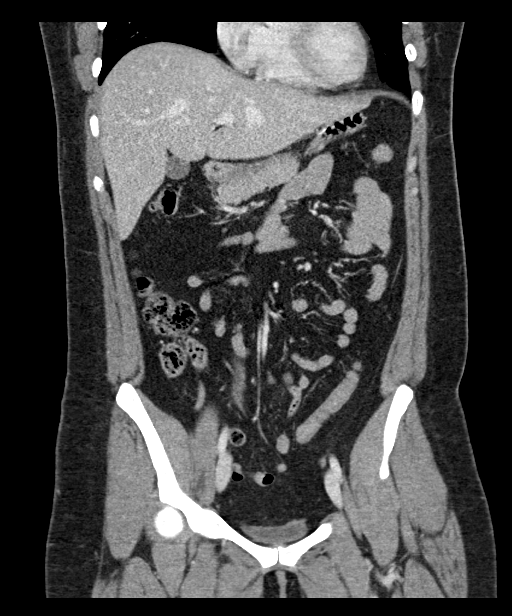
[im 44/98  soft-tissue]
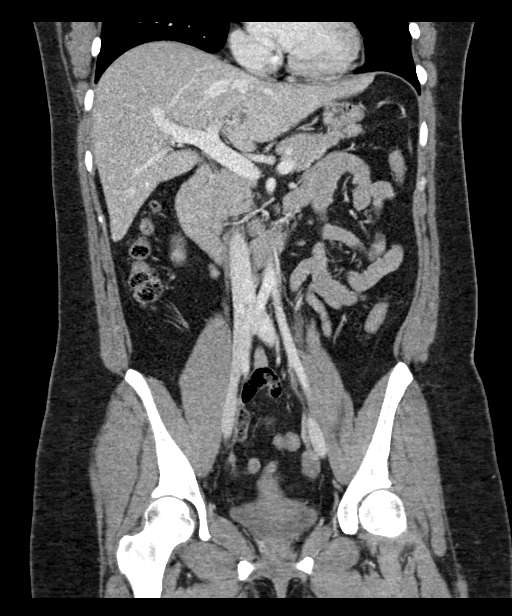
[im 54/98  soft-tissue]
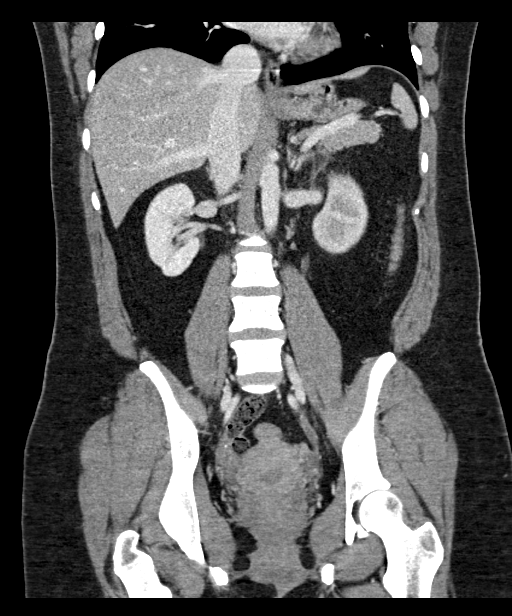

[17 of 46 positions shown; findings below may reference images not displayed]

FINDINGS: Lower chest: No acute abnormality.

Hepatobiliary: No focal liver abnormality is seen. No gallstones,
gallbladder wall thickening, or biliary dilatation.

Pancreas: Unremarkable. No pancreatic ductal dilatation or
surrounding inflammatory changes.

Spleen: Normal in size without focal abnormality.

Adrenals/Urinary Tract: There is left hydroureteronephrosis due to a
due to obstruction by 3 mm stone in the distal left ureter. The
right kidney is normal. The bilateral adrenal glands are normal. The
bladder is normal.

Stomach/Bowel: Stomach is within normal limits. Appendix appears
normal. No evidence of bowel wall thickening, distention, or
inflammatory changes.

Vascular/Lymphatic: No significant vascular findings are present. No
enlarged abdominal or pelvic lymph nodes.

Reproductive: Prostate is unremarkable.

Other: None.

Musculoskeletal: No acute abnormality.
IMPRESSION: Left hydroureteronephrosis due to obstruction by 3 mm stone in the
distal left ureter.

## 2021-03-07 IMAGING — US US PELVIS COMPLETE
1 series · 13 of 25 positions shown · non-contrast
Comparison: None.

CLINICAL DATA: Pelvic pain beginning today.

EXAM:
TRANSABDOMINAL AND TRANSVAGINAL ULTRASOUND OF PELVIS
DOPPLER ULTRASOUND OF OVARIES
TECHNIQUE: Both transabdominal and transvaginal ultrasound examinations of the
pelvis were performed. Transabdominal technique was performed for
global imaging of the pelvis including uterus, ovaries, adnexal
regions, and pelvic cul-de-sac.
It was necessary to proceed with endovaginal exam following the
transabdominal exam to visualize the adnexa and endometrium. Color
and duplex Doppler ultrasound was utilized to evaluate blood flow to
the ovaries.

[Series 1: us pelvis complete · 13 of 133 slices shown]
[im 1/133]
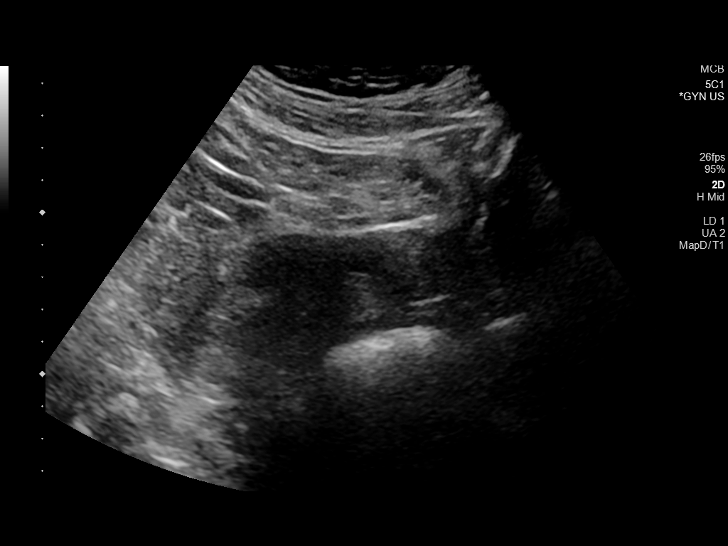
[im 12/133]
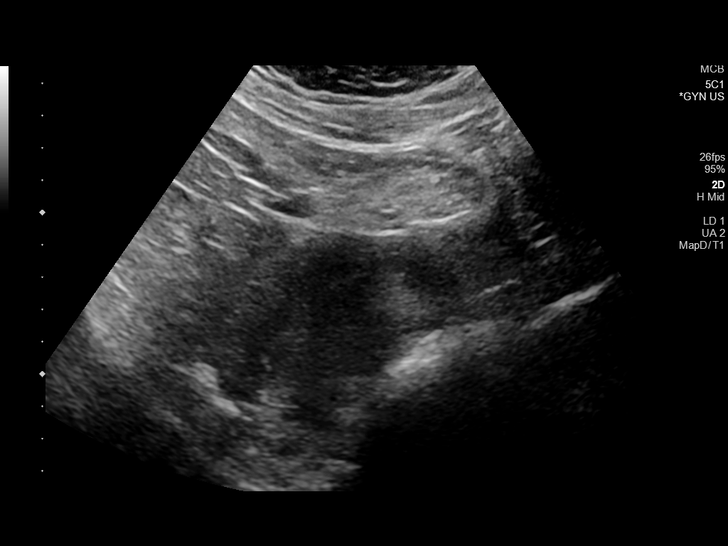
[im 23/133]
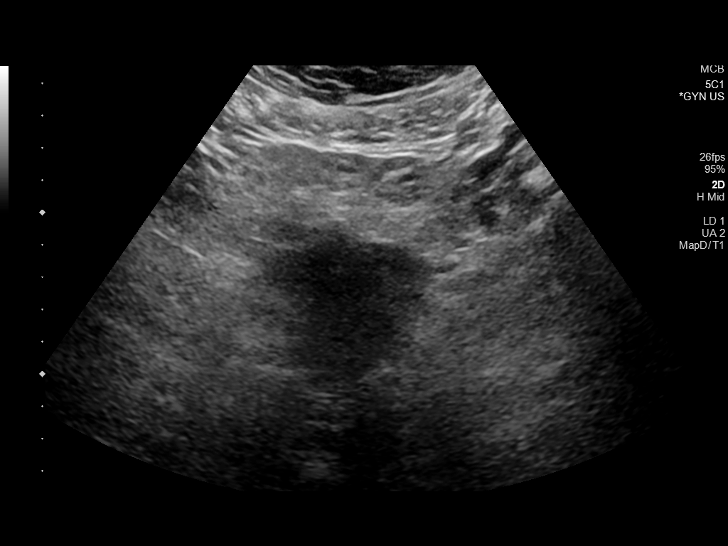
[im 34/133]
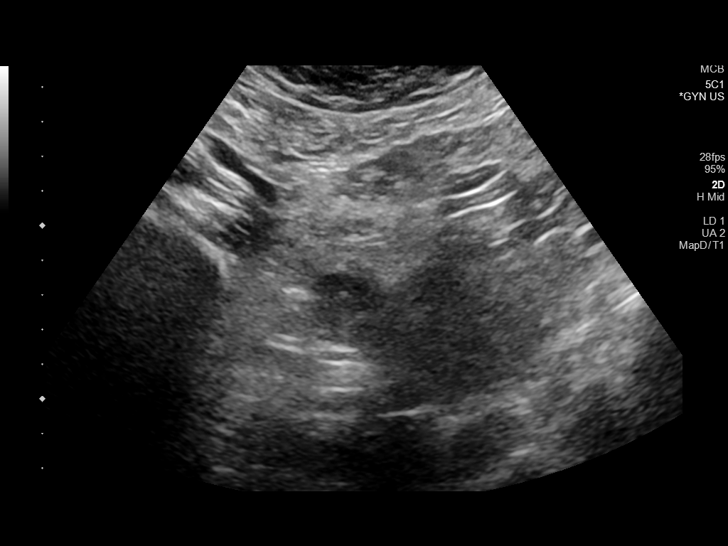
[im 45/133]
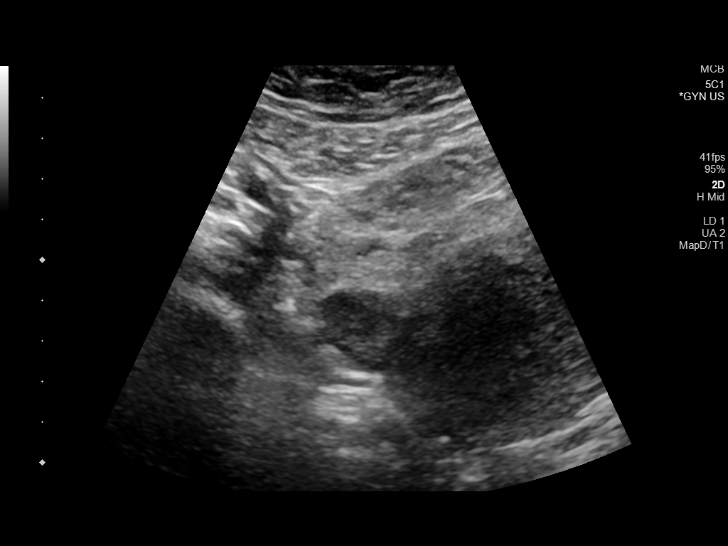
[im 56/133]
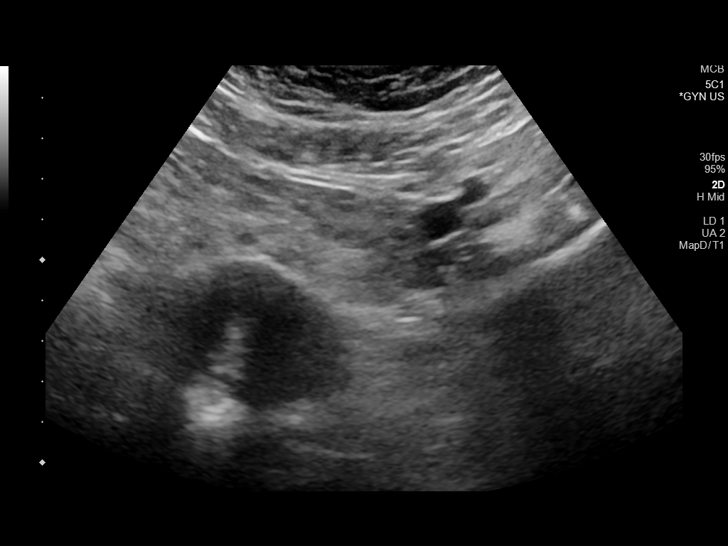
[im 67/133]
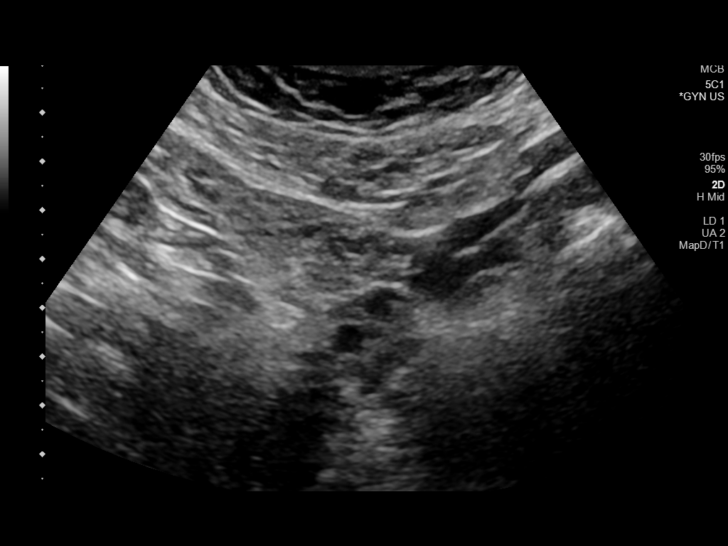
[im 78/133]
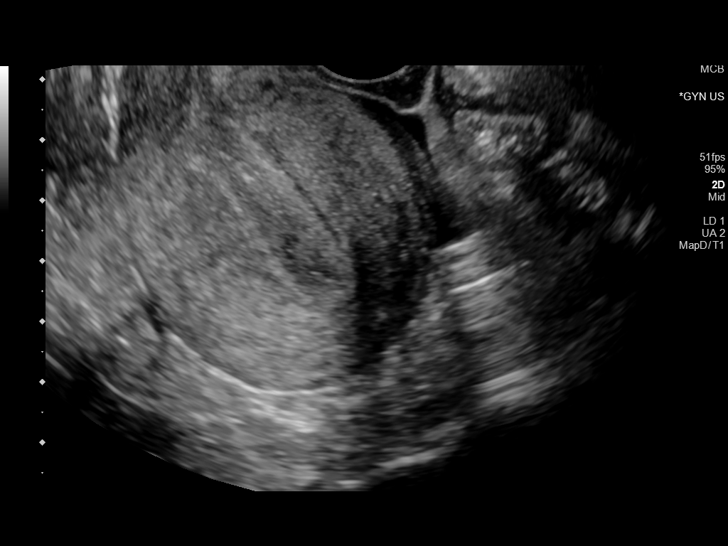
[im 89/133]
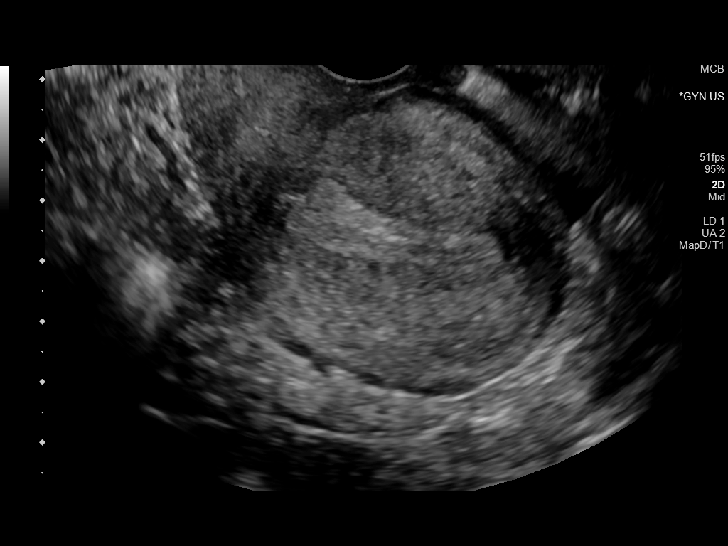
[im 100/133]
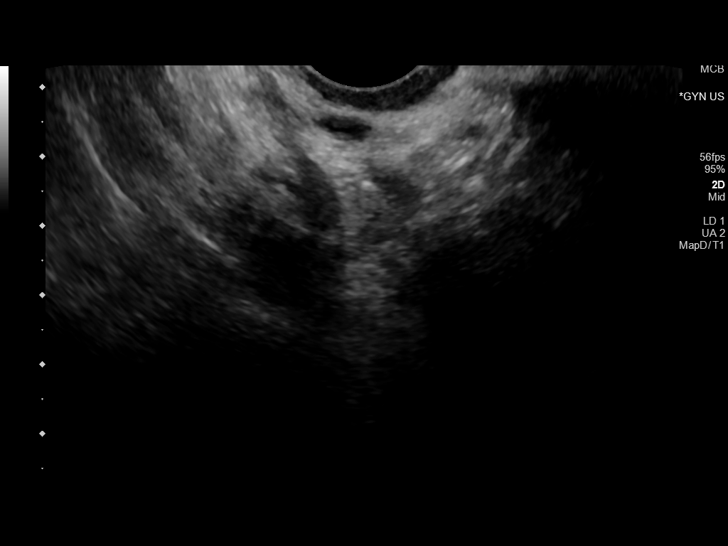
[im 111/133]
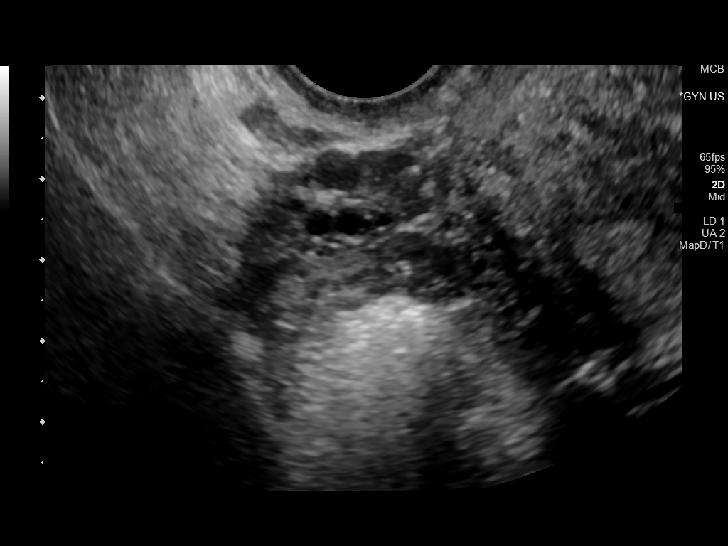
[im 122/133]
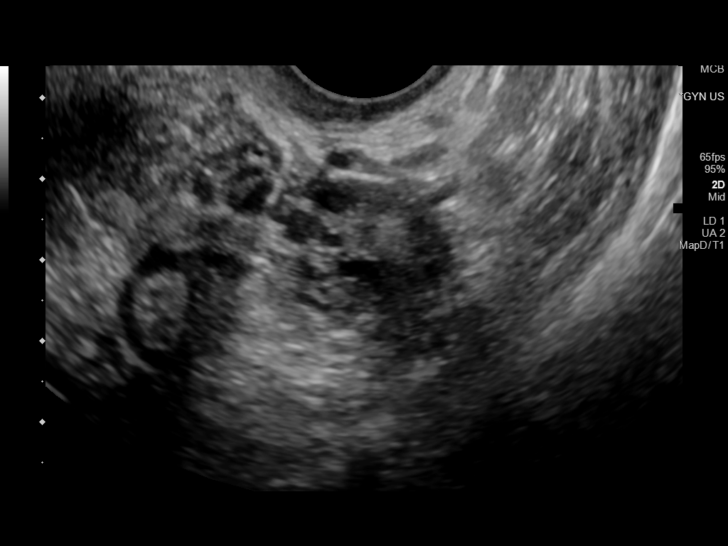
[im 133/133]
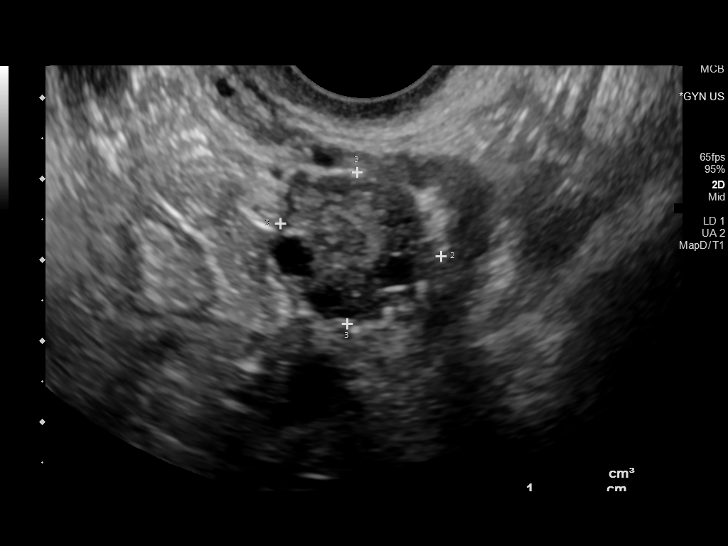

[13 of 25 positions shown; findings below may reference images not displayed]

FINDINGS: Uterus

Measurements: 11.9 x 5.2 x 5.1 cm = volume: 166 mL. No fibroids or
other mass visualized.

Endometrium

Thickness: 13 mm, within normal limits. No focal abnormality
visualized.

Right ovary

Measurements: 2.8 x 1.6 x 2.4 cm = volume: 5.4 mL. Normal
appearance/no adnexal mass.

Left ovary

Measurements: 2.0 x 1.9 x 2.0 cm = volume: 3.9 mL. Normal
appearance/no adnexal mass.

Pulsed Doppler evaluation of both ovaries demonstrates normal
low-resistance arterial and venous waveforms.

Other findings

Trace free fluid is likely physiologic
IMPRESSION: 1. Normal sonographic appearance of the uterus and adnexa.
2. Normal color and Doppler analysis of the adnexa.

## 2021-03-07 IMAGING — US US ART/VEN ABD/PELV/SCROTUM DOPPLER LTD
1 series · 13 of 25 positions shown · non-contrast
Comparison: None.

CLINICAL DATA: Pelvic pain beginning today.

EXAM:
TRANSABDOMINAL AND TRANSVAGINAL ULTRASOUND OF PELVIS
DOPPLER ULTRASOUND OF OVARIES
TECHNIQUE: Both transabdominal and transvaginal ultrasound examinations of the
pelvis were performed. Transabdominal technique was performed for
global imaging of the pelvis including uterus, ovaries, adnexal
regions, and pelvic cul-de-sac.
It was necessary to proceed with endovaginal exam following the
transabdominal exam to visualize the adnexa and endometrium. Color
and duplex Doppler ultrasound was utilized to evaluate blood flow to
the ovaries.

[Series 1: us art/ven abd/pelv/scrotum doppler ltd · 13 of 133 slices shown]
[im 1/133]
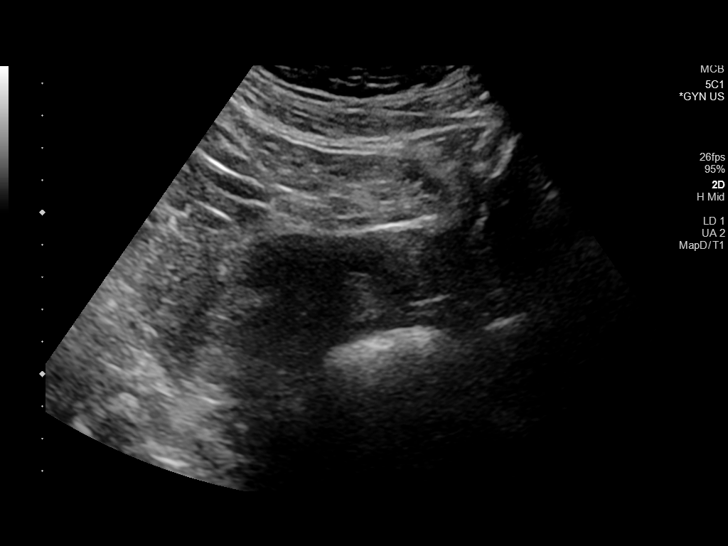
[im 12/133]
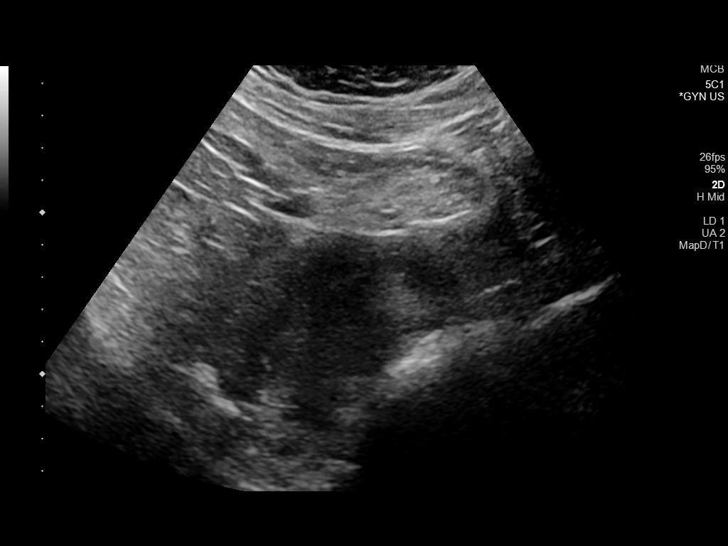
[im 23/133]
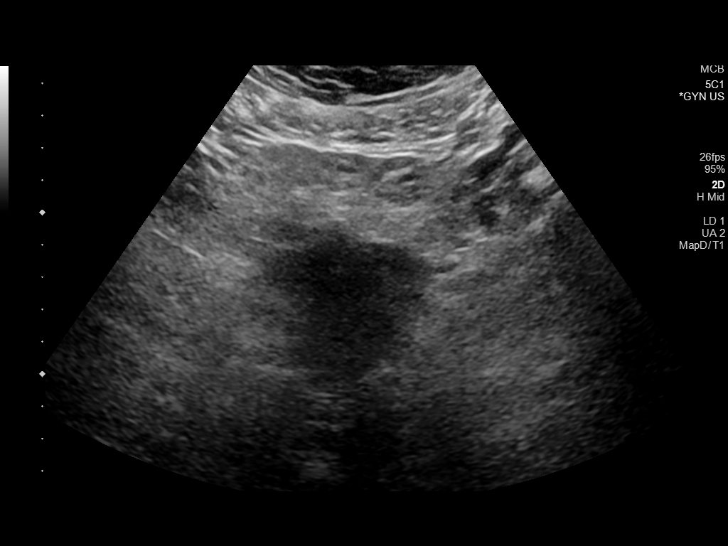
[im 34/133]
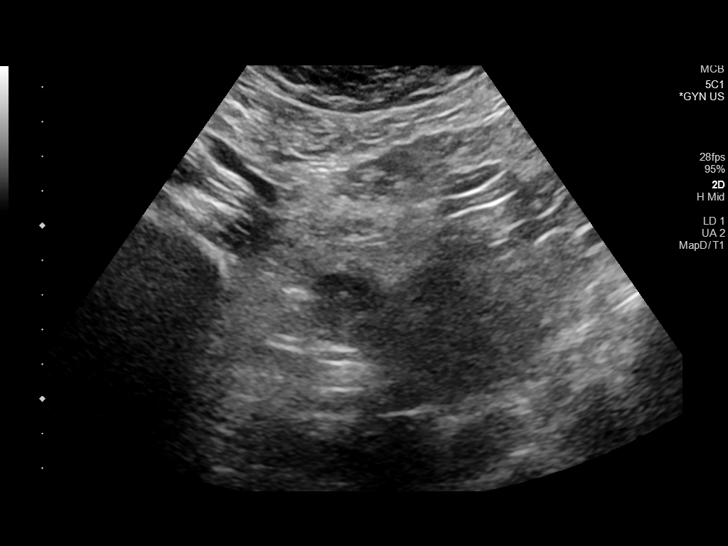
[im 45/133]
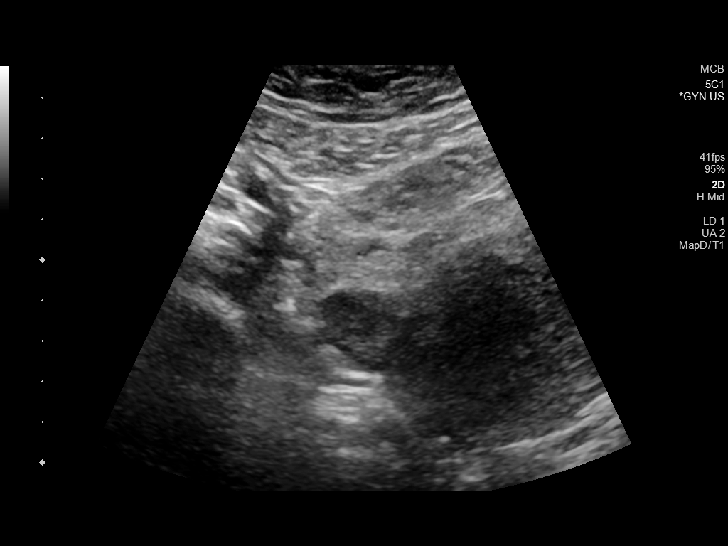
[im 56/133]
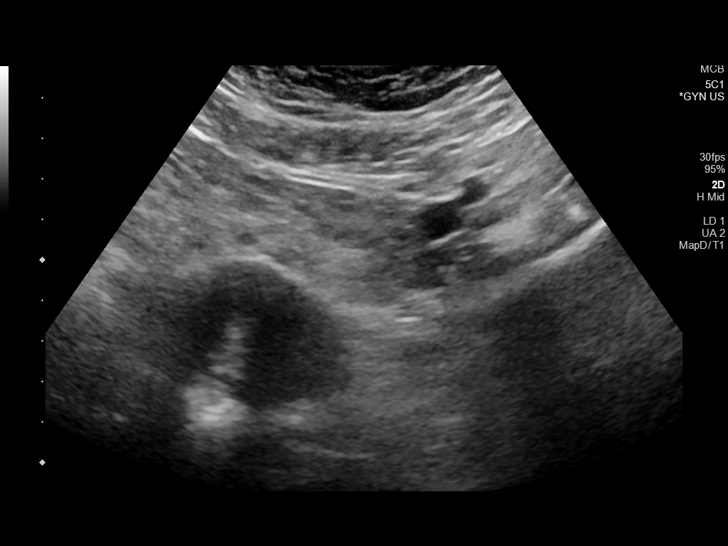
[im 67/133]
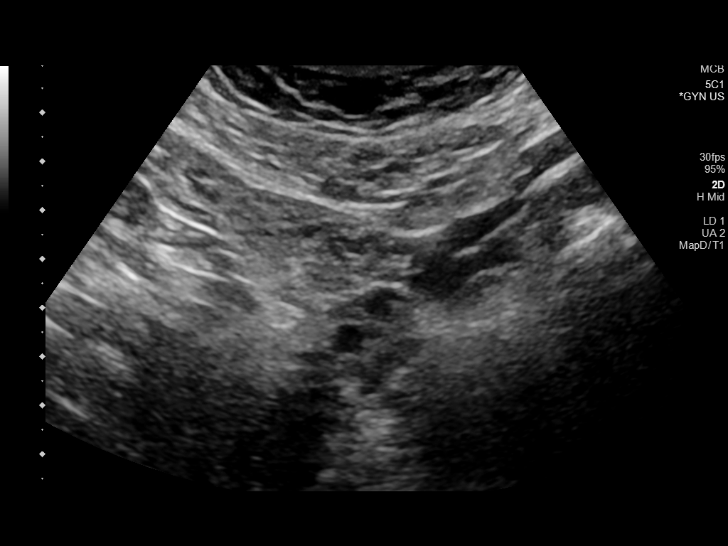
[im 78/133]
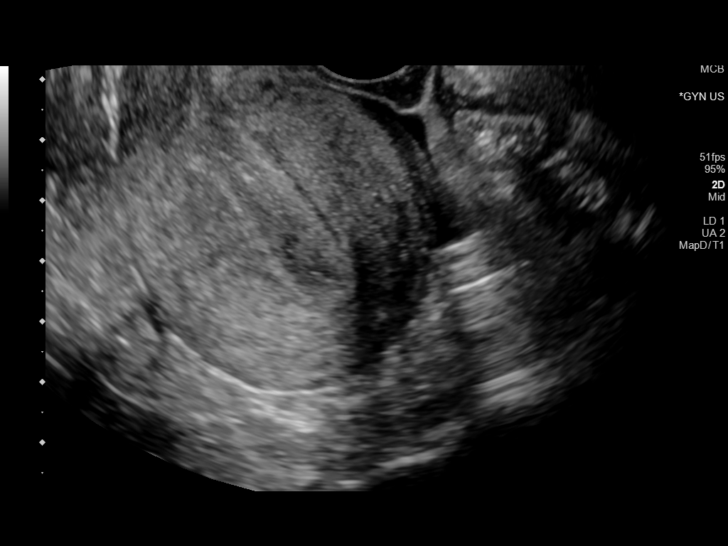
[im 89/133]
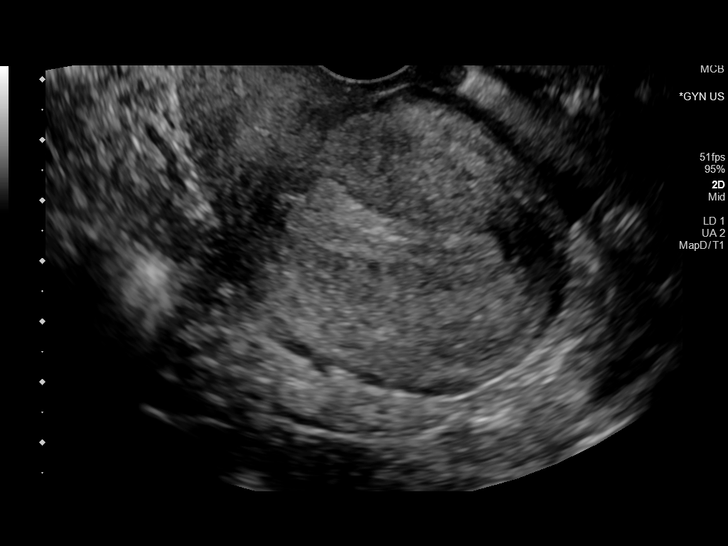
[im 100/133]
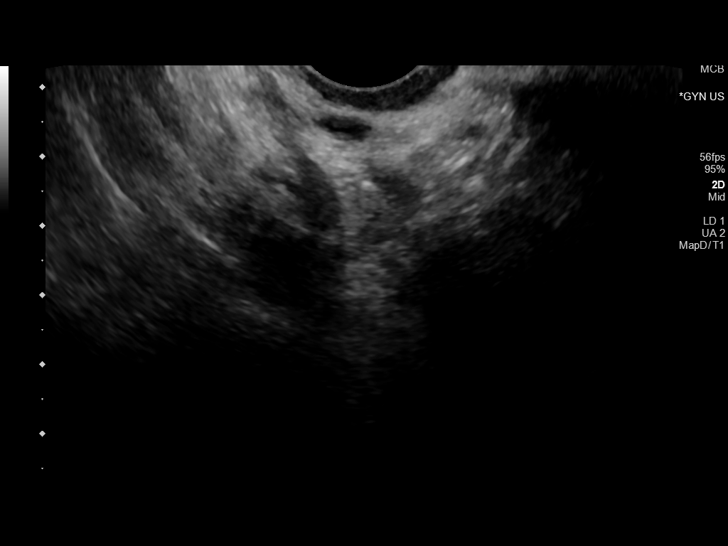
[im 111/133]
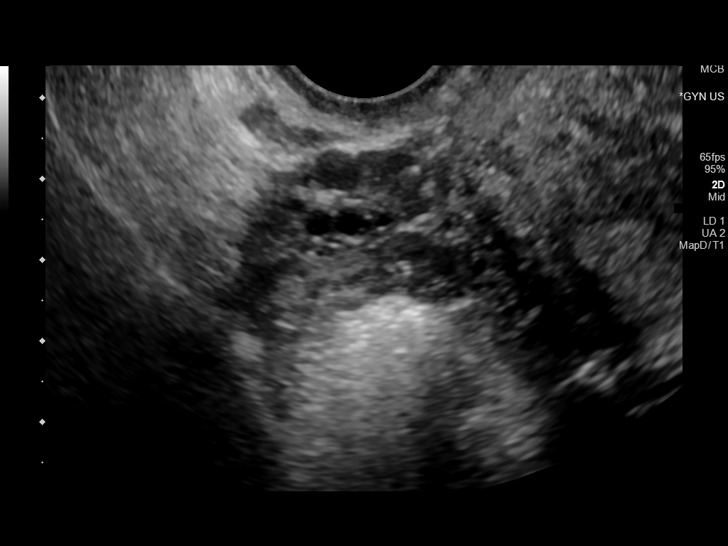
[im 122/133]
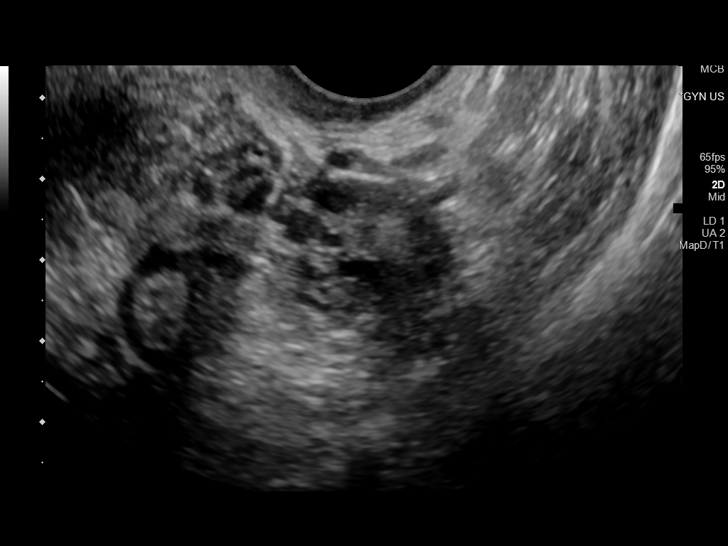
[im 133/133]
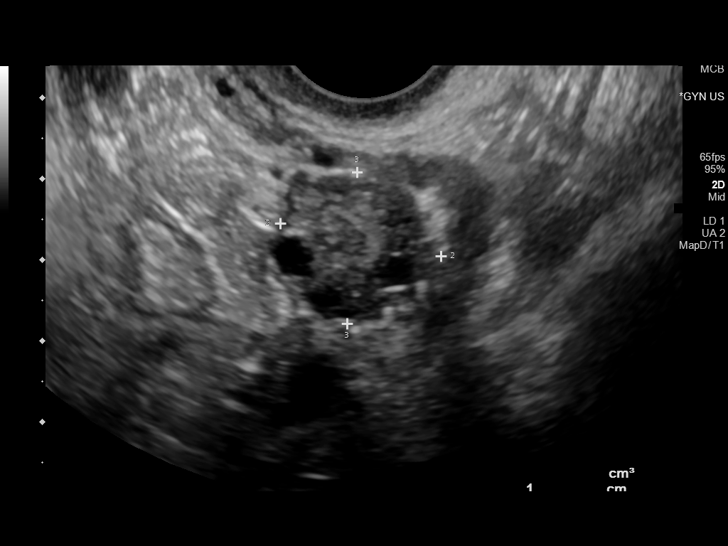

[13 of 25 positions shown; findings below may reference images not displayed]

FINDINGS: Uterus

Measurements: 11.9 x 5.2 x 5.1 cm = volume: 166 mL. No fibroids or
other mass visualized.

Endometrium

Thickness: 13 mm, within normal limits. No focal abnormality
visualized.

Right ovary

Measurements: 2.8 x 1.6 x 2.4 cm = volume: 5.4 mL. Normal
appearance/no adnexal mass.

Left ovary

Measurements: 2.0 x 1.9 x 2.0 cm = volume: 3.9 mL. Normal
appearance/no adnexal mass.

Pulsed Doppler evaluation of both ovaries demonstrates normal
low-resistance arterial and venous waveforms.

Other findings

Trace free fluid is likely physiologic
IMPRESSION: 1. Normal sonographic appearance of the uterus and adnexa.
2. Normal color and Doppler analysis of the adnexa.

## 2021-03-19 ENCOUNTER — Ambulatory Visit: Payer: BC Managed Care – PPO | Admitting: Family Medicine

## 2021-07-31 ENCOUNTER — Encounter: Payer: Self-pay | Admitting: Nurse Practitioner

## 2021-07-31 ENCOUNTER — Ambulatory Visit (INDEPENDENT_AMBULATORY_CARE_PROVIDER_SITE_OTHER): Payer: BC Managed Care – PPO | Admitting: Nurse Practitioner

## 2021-07-31 ENCOUNTER — Other Ambulatory Visit: Payer: Self-pay

## 2021-07-31 VITALS — BP 116/74 | HR 71 | Temp 98.0°F | Ht 63.0 in | Wt 176.6 lb

## 2021-07-31 DIAGNOSIS — Z7689 Persons encountering health services in other specified circumstances: Secondary | ICD-10-CM

## 2021-07-31 DIAGNOSIS — Z8639 Personal history of other endocrine, nutritional and metabolic disease: Secondary | ICD-10-CM

## 2021-07-31 DIAGNOSIS — Z2821 Immunization not carried out because of patient refusal: Secondary | ICD-10-CM

## 2021-07-31 DIAGNOSIS — Z23 Encounter for immunization: Secondary | ICD-10-CM | POA: Diagnosis not present

## 2021-07-31 DIAGNOSIS — Z0000 Encounter for general adult medical examination without abnormal findings: Secondary | ICD-10-CM | POA: Diagnosis not present

## 2021-07-31 DIAGNOSIS — E559 Vitamin D deficiency, unspecified: Secondary | ICD-10-CM

## 2021-07-31 NOTE — Progress Notes (Signed)
I,Jameka J Llittleton,acting as a Education administrator for [employer], NP.,have documented all relevant documentation on the behalf of [employer], NP,as directed by  Bary Castilla, NP while in the presence of Bary Castilla, NP.  This visit occurred during the SARS-CoV-2 public health emergency.  Safety protocols were in place, including screening questions prior to the visit, additional usage of staff PPE, and extensive cleaning of exam room while observing appropriate contact time as indicated for disinfecting solutions.  Subjective:     Patient ID: Alexandria Jackson , female    DOB: 1985-12-29 , 35 y.o.   MRN: [id number]   Chief Complaint  Patient presents with   Establish Care   Joint Pain   thigh pain    HPI  Patient presents today to establish primary care. She would like to have a physical done. She also complains that she would feel some pain in her hands and in her upper thigh. She stated the pain is every once in a while. LMP: last week 07/31/21  She does not have a diet and she does not exercise  She has 3 kids.      Past Medical History:  Diagnosis Date   Allergy    Anxiety    Iron deficiency anemia 09/05/2019   Menorrhagia 09/05/2019   Thyroid disease      Family History  Problem Relation Age of Onset   Cancer Mother     No current outpatient medications on file.   No Known Allergies    The patient states she uses none for birth control. Last LMP was No LMP recorded.. Negative for Dysmenorrhea. Negative for: breast discharge, breast lump(s), breast pain and breast self exam. Associated symptoms include abnormal vaginal bleeding. Pertinent negatives include abnormal bleeding (hematology), anxiety, decreased libido, depression, difficulty falling sleep, dyspareunia, history of infertility, nocturia, sexual dysfunction, sleep disturbances, urinary incontinence, urinary urgency, vaginal discharge and vaginal itching. Diet regular.The patient states her  exercise level is    . The patient's tobacco use is:  Social History   Tobacco Use  Smoking Status Never  Smokeless Tobacco Never  . She has been exposed to passive smoke. The patient's alcohol use is:  Social History   Substance and Sexual Activity  Alcohol Use Yes   Comment: occasion  . Additional information: Last pap 2020, next one scheduled for 2023.    Review of Systems  Constitutional: Negative.  Negative for chills, fatigue and fever.  HENT: Negative.  Negative for congestion, postnasal drip, rhinorrhea and tinnitus.   Eyes: Negative.   Respiratory: Negative.  Negative for cough, choking, shortness of breath and wheezing.   Cardiovascular: Negative.  Negative for chest pain and palpitations.  Gastrointestinal: Negative.   Endocrine: Negative.   Genitourinary: Negative.   Musculoskeletal: Negative.  Negative for arthralgias and myalgias.  Skin: Negative.   Allergic/Immunologic: Negative.   Neurological: Negative.  Negative for dizziness and weakness.  Hematological: Negative.   Psychiatric/Behavioral: Negative.      Today's Vitals   07/31/21 0855  BP: 116/74  Pulse: 71  Temp: 98 F (36.7 C)  Weight: 176 lb 9.6 oz (80.1 kg)  Height: 5' 3"  (1.6 m)  PainSc: 0-No pain   Body mass index is 31.28 kg/m.  Wt Readings from Last 3 Encounters:  07/31/21 176 lb 9.6 oz (80.1 kg)  12/17/20 169 lb (76.7 kg)  01/17/20 169 lb 6.4 oz (76.8 kg)     Objective:  Physical Exam Vitals and nursing note reviewed.  Constitutional:  Appearance: Normal appearance.  HENT:     Head: Normocephalic and atraumatic.     Right Ear: Tympanic membrane, ear canal and external ear normal. There is no impacted cerumen.     Left Ear: Tympanic membrane, ear canal and external ear normal. There is no impacted cerumen.     Nose: Nose normal.     Mouth/Throat:     Mouth: Mucous membranes are moist.     Pharynx: Oropharynx is clear.  Eyes:     Extraocular Movements: Extraocular movements  intact.     Conjunctiva/sclera: Conjunctivae normal.     Pupils: Pupils are equal, round, and reactive to light.  Cardiovascular:     Rate and Rhythm: Normal rate and regular rhythm.     Pulses: Normal pulses.     Heart sounds: Normal heart sounds. No murmur heard. Pulmonary:     Effort: Pulmonary effort is normal. No respiratory distress.     Breath sounds: Normal breath sounds. No wheezing.  Chest:  Breasts:    Tanner Score is 5.     Right: Normal.     Left: Normal.  Abdominal:     General: Abdomen is flat. Bowel sounds are normal.     Palpations: Abdomen is soft.  Genitourinary:    Comments: deferred Musculoskeletal:        General: Normal range of motion.     Cervical back: Normal range of motion and neck supple.  Skin:    General: Skin is warm and dry.     Capillary Refill: Capillary refill takes less than 2 seconds.  Neurological:     General: No focal deficit present.     Mental Status: She is alert and oriented to person, place, and time.  Psychiatric:        Mood and Affect: Mood normal.        Behavior: Behavior normal.        Assessment And Plan:     1. Encounter to establish care with new doctor --Patient is here to establish care. Martin Majestic over patient medical, family, social and surgical history. -Reviewed with patient their medications and any allergies  -Reviewed with patient their sexual orientation, drug/tobacco and alcohol use -Dicussed any new concerns with patient  -recommended patient comes in for a physical exam and complete blood work.  -Educated patient about the importance of annual screenings and immunizations.  -Advised patient to eat a healthy diet along with exercise for atleast 30-45 min atleast 4-5 days of the week.   2. Encounter for general adult medical examination w/o abnormal findings --Patient is here for their annual physical exam and we discussed any changes to medication and medical history.  -Behavior modification was discussed  as well as diet and exercise history  -Patient will continue to exercise regularly and modify their diet.  -Recommendation for yearly physical annuals, immunization and screenings including mammogram and colonoscopy were discussed with the patient.  -Recommended intake of multivitamin, vitamin D and calcium.  -Individualized advise was given to the patient pertaining to their own health history in regards to diet, exercise, medical condition and referrals.  - CBC - CMP14+EGFR - Lipid panel - Hemoglobin A1c - Hepatitis C antibody  3. History of hypothyroidism -She has a history of hypothyroidism but does not take any meds currently for it.  - TSH + free T4  4. Vitamin D deficiency -Will check and supplement if needed. Advised patient to spend atleast 15 min. Daily in sunlight.  - Vitamin D (25  hydroxy)  5. Need for Tdap vaccination   6. Influenza vaccination declined -Provided with education and the risks of contracting the flu   7. COVID-19 vaccination declined -Provided with education and the risk of contracting covid.   The patient was encouraged to call or send a message through [facility] for any questions or concerns.   Follow up: if symptoms persist or do not get better.   Side effects and appropriate use of all the medication(s) were discussed with the patient today. Patient advised to use the medication(s) as directed by their healthcare provider. The patient was encouraged to read, review, and understand all associated package inserts and contact our office with any questions or concerns. The patient accepts the risks of the treatment plan and had an opportunity to ask questions.   Staying healthy and adopting a healthy lifestyle for your overall health is important. You should eat 7 or more servings of fruits and vegetables per day. You should drink plenty of water to keep yourself hydrated and your kidneys healthy. This includes about 65-80+ fluid ounces of water. Limit your  intake of animal fats especially for elevated cholesterol. Avoid highly processed food and limit your salt intake if you have hypertension. Avoid foods high in saturated/Trans fats. Along with a healthy diet it is also very important to maintain time for yourself to maintain a healthy mental health with low stress levels. You should get atleast 150 min of moderate intensity exercise weekly for a healthy heart. Along with eating right and exercising, aim for at least 7-9 hours of sleep daily.  Eat more whole grains which includes barley, wheat berries, oats, brown rice and whole wheat pasta. Use healthy plant oils which include olive, soy, corn, sunflower and peanut. Limit your caffeine and sugary drinks. Limit your intake of fast foods. Limit milk and dairy products to one or two daily servings.   Patient was given opportunity to ask questions. Patient verbalized understanding of the plan and was able to repeat key elements of the plan. All questions were answered to their satisfaction.  Raman Berel Najjar, DNP   I, Raman Kamylle Axelson have reviewed all documentation for this visit. The documentation on 07/31/21 for the exam, diagnosis, procedures, and orders are all accurate and complete.    THE PATIENT IS ENCOURAGED TO PRACTICE SOCIAL DISTANCING DUE TO THE COVID-19 PANDEMIC.

## 2021-07-31 NOTE — Addendum Note (Signed)
Addended by: Argentina Ponder on: 07/31/2021 09:24 AM   Modules accepted: Orders

## 2021-07-31 NOTE — Patient Instructions (Signed)
Health Maintenance, Female Adopting a healthy lifestyle and getting preventive care are important in promoting health and wellness. Ask your health care provider about: The right schedule for you to have regular tests and exams. Things you can do on your own to prevent diseases and keep yourself healthy. What should I know about diet, weight, and exercise? Eat a healthy diet  Eat a diet that includes plenty of vegetables, fruits, low-fat dairy products, and lean protein. Do not eat a lot of foods that are high in solid fats, added sugars, or sodium. Maintain a healthy weight Body mass index (BMI) is used to identify weight problems. It estimates body fat based on height and weight. Your health care provider can help determine your BMI and help you achieve or maintain a healthy weight. Get regular exercise Get regular exercise. This is one of the most important things you can do for your health. Most adults should: Exercise for at least 150 minutes each week. The exercise should increase your heart rate and make you sweat (moderate-intensity exercise). Do strengthening exercises at least twice a week. This is in addition to the moderate-intensity exercise. Spend less time sitting. Even light physical activity can be beneficial. Watch cholesterol and blood lipids Have your blood tested for lipids and cholesterol at 37 years of age, then have this test every 5 years. Have your cholesterol levels checked more often if: Your lipid or cholesterol levels are high. You are older than 37 years of age. You are at high risk for heart disease. What should I know about cancer screening? Depending on your health history and family history, you may need to have cancer screening at various ages. This may include screening for: Breast cancer. Cervical cancer. Colorectal cancer. Skin cancer. Lung cancer. What should I know about heart disease, diabetes, and high blood pressure? Blood pressure and heart  disease High blood pressure causes heart disease and increases the risk of stroke. This is more likely to develop in people who have high blood pressure readings, are of African descent, or are overweight. Have your blood pressure checked: Every 3-5 years if you are 18-39 years of age. Every year if you are 40 years old or older. Diabetes Have regular diabetes screenings. This checks your fasting blood sugar level. Have the screening done: Once every three years after age 40 if you are at a normal weight and have a low risk for diabetes. More often and at a younger age if you are overweight or have a high risk for diabetes. What should I know about preventing infection? Hepatitis B If you have a higher risk for hepatitis B, you should be screened for this virus. Talk with your health care provider to find out if you are at risk for hepatitis B infection. Hepatitis C Testing is recommended for: Everyone born from 1945 through 1965. Anyone with known risk factors for hepatitis C. Sexually transmitted infections (STIs) Get screened for STIs, including gonorrhea and chlamydia, if: You are sexually active and are younger than 37 years of age. You are older than 37 years of age and your health care provider tells you that you are at risk for this type of infection. Your sexual activity has changed since you were last screened, and you are at increased risk for chlamydia or gonorrhea. Ask your health care provider if you are at risk. Ask your health care provider about whether you are at high risk for HIV. Your health care provider may recommend a prescription medicine   to help prevent HIV infection. If you choose to take medicine to prevent HIV, you should first get tested for HIV. You should then be tested every 3 months for as long as you are taking the medicine. Pregnancy If you are about to stop having your period (premenopausal) and you may become pregnant, seek counseling before you get  pregnant. Take 400 to 800 micrograms (mcg) of folic acid every day if you become pregnant. Ask for birth control (contraception) if you want to prevent pregnancy. Osteoporosis and menopause Osteoporosis is a disease in which the bones lose minerals and strength with aging. This can result in bone fractures. If you are 65 years old or older, or if you are at risk for osteoporosis and fractures, ask your health care provider if you should: Be screened for bone loss. Take a calcium or vitamin D supplement to lower your risk of fractures. Be given hormone replacement therapy (HRT) to treat symptoms of menopause. Follow these instructions at home: Lifestyle Do not use any products that contain nicotine or tobacco, such as cigarettes, e-cigarettes, and chewing tobacco. If you need help quitting, ask your health care provider. Do not use street drugs. Do not share needles. Ask your health care provider for help if you need support or information about quitting drugs. Alcohol use Do not drink alcohol if: Your health care provider tells you not to drink. You are pregnant, may be pregnant, or are planning to become pregnant. If you drink alcohol: Limit how much you use to 0-1 drink a day. Limit intake if you are breastfeeding. Be aware of how much alcohol is in your drink. In the [place] one drink equals one 12 oz bottle of beer (355 mL), one 5 oz glass of wine (148 mL), or one 1 oz glass of hard liquor (44 mL). General instructions Schedule regular health, dental, and eye exams. Stay current with your vaccines. Tell your health care provider if: You often feel depressed. You have ever been abused or do not feel safe at home. Summary Adopting a healthy lifestyle and getting preventive care are important in promoting health and wellness. Follow your health care provider's instructions about healthy diet, exercising, and getting tested or screened for diseases. Follow your health care provider's  instructions on monitoring your cholesterol and blood pressure. This information is not intended to replace advice given to you by your health care provider. Make sure you discuss any questions you have with your health care provider. Document Revised: 12/27/2020 Document Reviewed: 10/12/2018 [employer] Patient Education  2022 [employer]  

## 2021-08-01 LAB — CMP14+EGFR
ALT: 7 IU/L (ref 0–32)
AST: 16 IU/L (ref 0–40)
Albumin/Globulin Ratio: 1.4 (ref 1.2–2.2)
Albumin: 4.2 g/dL (ref 3.8–4.8)
Alkaline Phosphatase: 99 IU/L (ref 44–121)
BUN/Creatinine Ratio: 15 (ref 9–23)
BUN: 11 mg/dL (ref 6–20)
Bilirubin Total: 0.4 mg/dL (ref 0.0–1.2)
CO2: 22 mmol/L (ref 20–29)
Calcium: 9.3 mg/dL (ref 8.7–10.2)
Chloride: 103 mmol/L (ref 96–106)
Creatinine, Ser: 0.73 mg/dL (ref 0.57–1.00)
Globulin, Total: 2.9 g/dL (ref 1.5–4.5)
Glucose: 108 mg/dL — ABNORMAL HIGH (ref 70–99)
Potassium: 4.4 mmol/L (ref 3.5–5.2)
Sodium: 139 mmol/L (ref 134–144)
Total Protein: 7.1 g/dL (ref 6.0–8.5)
eGFR: 109 mL/min/{1.73_m2} (ref >59)

## 2021-08-01 LAB — CBC
Hematocrit: 36.4 % (ref 34.0–46.6)
Hemoglobin: 11.6 g/dL (ref 11.1–15.9)
MCH: 25.4 pg — ABNORMAL LOW (ref 26.6–33.0)
MCHC: 31.9 g/dL (ref 31.5–35.7)
MCV: 80 fL (ref 79–97)
Platelets: 386 10*3/uL (ref 150–450)
RBC: 4.56 x10E6/uL (ref 3.77–5.28)
RDW: 16.4 % — ABNORMAL HIGH (ref 11.7–15.4)
WBC: 5.3 10*3/uL (ref 3.4–10.8)

## 2021-08-01 LAB — HEMOGLOBIN A1C
Est. average glucose Bld gHb Est-mCnc: 117 mg/dL
Hgb A1c MFr Bld: 5.7 % — ABNORMAL HIGH (ref 4.8–5.6)

## 2021-08-01 LAB — LIPID PANEL
Chol/HDL Ratio: 4.1 ratio (ref 0.0–4.4)
Cholesterol, Total: 199 mg/dL (ref 100–199)
HDL: 49 mg/dL (ref >39)
LDL Chol Calc (NIH): 123 mg/dL — ABNORMAL HIGH (ref 0–99)
Triglycerides: 155 mg/dL — ABNORMAL HIGH (ref 0–149)
VLDL Cholesterol Cal: 27 mg/dL (ref 5–40)

## 2021-08-01 LAB — HEPATITIS C ANTIBODY: Hep C Virus Ab: <0.1 s/co ratio (ref 0.0–0.9)

## 2021-08-01 LAB — VITAMIN D 25 HYDROXY (VIT D DEFICIENCY, FRACTURES): Vit D, 25-Hydroxy: 17.7 ng/mL — ABNORMAL LOW (ref 30.0–100.0)

## 2021-08-01 LAB — TSH+FREE T4
Free T4: 1.15 ng/dL (ref 0.82–1.77)
TSH: 0.799 u[IU]/mL (ref 0.450–4.500)

## 2021-08-05 ENCOUNTER — Other Ambulatory Visit: Payer: Self-pay | Admitting: Nurse Practitioner

## 2021-08-05 DIAGNOSIS — E559 Vitamin D deficiency, unspecified: Secondary | ICD-10-CM

## 2021-08-05 MED ORDER — VITAMIN D (ERGOCALCIFEROL) 1.25 MG (50000 UNIT) PO CAPS: 50000.0000 [IU] | ORAL_CAPSULE | ORAL | 0 refills | Status: DC

## 2021-11-24 ENCOUNTER — Encounter: Payer: Self-pay | Admitting: Nurse Practitioner

## 2021-11-24 ENCOUNTER — Encounter: Payer: BC Managed Care – PPO | Admitting: Nurse Practitioner

## 2021-11-24 ENCOUNTER — Ambulatory Visit (INDEPENDENT_AMBULATORY_CARE_PROVIDER_SITE_OTHER): Payer: BC Managed Care – PPO | Admitting: Nurse Practitioner

## 2021-11-24 ENCOUNTER — Other Ambulatory Visit: Payer: Self-pay

## 2021-11-24 DIAGNOSIS — R051 Acute cough: Secondary | ICD-10-CM

## 2021-11-24 DIAGNOSIS — E559 Vitamin D deficiency, unspecified: Secondary | ICD-10-CM | POA: Diagnosis not present

## 2021-11-24 DIAGNOSIS — R0981 Nasal congestion: Secondary | ICD-10-CM

## 2021-11-24 MED ORDER — VITAMIN D (ERGOCALCIFEROL) 1.25 MG (50000 UNIT) PO CAPS: 50000.0000 [IU] | ORAL_CAPSULE | ORAL | 1 refills | Status: DC

## 2021-11-24 NOTE — Progress Notes (Signed)
Virtual Visit via failed video visit   This visit type was conducted due to national recommendations for restrictions regarding the COVID-19 Pandemic (e.g. social distancing) in an effort to limit this patient's exposure and mitigate transmission in our community.  Due to her co-morbid illnesses, this patient is at least at moderate risk for complications without adequate follow up.  This format is felt to be most appropriate for this patient at this time.  All issues noted in this document were discussed and addressed.  A limited physical exam was performed with this format.    This visit type was conducted due to national recommendations for restrictions regarding the COVID-19 Pandemic (e.g. social distancing) in an effort to limit this patient's exposure and mitigate transmission in our community.  Patients identity confirmed using two different identifiers.  This format is felt to be most appropriate for this patient at this time.  All issues noted in this document were discussed and addressed.  No physical exam was performed (except for noted visual exam findings with Video Visits).    Date:  11/24/2021   ID:  Alexandria ConesMarina Jisselle Jackson, DOB 1986-08-19, MRN [id number]  Patient Location:  Home - spoke with Alexandria MeyerMarina Jackson  Provider location:   Office    Chief Complaint:  cold symptoms  History of Present Illness:    Alexandria PinksMarina Jisselle Jackson is a 36 y.o. female who presents via telephone conferencing for a telehealth visit today.    The patient does have symptoms concerning for COVID-19 infection (fever, chills, cough, or new shortness of breath).   She has a telephone visit due to having stuffy and runny nose since Sunday. She has a sore throat. She has a dry cough. She has green drainage. Denies fever. She took ibuprofen for her headache. Denies shortness of breath or chest pain. Noone else in the home is sick. She has not had the covid vaccine.      Past Medical History:  Diagnosis Date    Allergy    Anxiety    Iron deficiency anemia 09/05/2019   Menorrhagia 09/05/2019   Thyroid disease    Past Surgical History:  Procedure Laterality Date   TUBAL LIGATION       No outpatient medications have been marked as taking for the 11/24/21 encounter (Office Visit) with Arnette FeltsMoore, Alain Deschene, FNP.     Allergies:   Patient has no known allergies.   Social History   Tobacco Use   Smoking status: Never   Smokeless tobacco: Never  Substance Use Topics   Alcohol use: Yes    Comment: occasion   Drug use: No     Family Hx: The patient's family history includes Cancer in her mother.  ROS:   Please see the history of present illness.    Review of Systems  Constitutional:  Positive for malaise/fatigue. Negative for chills and fever.  HENT:  Positive for congestion and sore throat. Negative for sinus pain.   Respiratory:  Positive for cough and sputum production (green). Negative for shortness of breath and wheezing.   Cardiovascular:  Negative for chest pain.  Neurological:  Positive for headaches. Negative for dizziness.  Psychiatric/Behavioral:  Negative for depression.    All other systems reviewed and are negative.   Labs/Other Tests and Data Reviewed:    Recent Labs: 07/31/2021: ALT 7; BUN 11; Creatinine, Ser 0.73; Hemoglobin 11.6; Platelets 386; Potassium 4.4; Sodium 139; TSH 0.799   Recent Lipid Panel Lab Results  Component Value Date/Time   CHOL 199  07/31/2021 09:29 AM   TRIG 155 (H) 07/31/2021 09:29 AM   HDL 49 07/31/2021 09:29 AM   CHOLHDL 4.1 07/31/2021 09:29 AM   LDLCALC 123 (H) 07/31/2021 09:29 AM    Wt Readings from Last 3 Encounters:  07/31/21 176 lb 9.6 oz (80.1 kg)  12/17/20 169 lb (76.7 kg)  01/17/20 169 lb 6.4 oz (76.8 kg)     Exam:    Vital Signs:  There were no vitals taken for this visit.    Physical Exam Vitals reviewed.  Constitutional:      General: She is not in acute distress.    Appearance: Normal appearance.  Cardiovascular:      Rate and Rhythm: Normal rate and regular rhythm.     Pulses: Normal pulses.     Heart sounds: Normal heart sounds. No murmur heard. Pulmonary:     Effort: Pulmonary effort is normal. No respiratory distress.     Breath sounds: Normal breath sounds.  Skin:    Capillary Refill: Capillary refill takes less than 2 seconds.  Neurological:     General: No focal deficit present.     Mental Status: She is alert and oriented to person, place, and time.     Cranial Nerves: No cranial nerve deficit.     Motor: No weakness.  Psychiatric:        Mood and Affect: Mood normal.        Behavior: Behavior normal.        Thought Content: Thought content normal.        Judgment: Judgment normal.    ASSESSMENT & PLAN:    1. Nasal congestion Reports her home covid test is negative. Will provide a work note to return on Wednesday. Advised if still not feeling well on Wednesday to retest for covid. Advised patient to take Vitamin C, D, Zinc.  Keep yourself hydrated with a lot of water and rest. Take Delsym for cough and Mucinex as need. Take Tylenol or pain reliever every 4-6 hours as needed for pain/fever/body ache. You can also take OTC oscillococcinum to help with your symptoms.    2. Acute cough Take Delsym as needed and stay well hydrated with water.   3. Vitamin D deficiency Refill sent to pharmacy.  - Vitamin D, Ergocalciferol, (DRISDOL) 1.25 MG (50000 UNIT) CAPS capsule; Take 1 capsule (50,000 Units total) by mouth every 7 (seven) days.  Dispense: 12 capsule; Refill: 1   COVID-19 Education: The signs and symptoms of COVID-19 were discussed with the patient and how to seek care for testing (follow up with PCP or arrange E-visit).  The importance of social distancing was discussed today.  Patient Risk:   After full review of this patients clinical status, I feel that they are at least moderate risk at this time.  Time:   Today, I have spent 13.15 minutes/ seconds with the patient with  telehealth technology discussing above diagnoses.     Medication Adjustments/Labs and Tests Ordered: Current medicines are reviewed at length with the patient today.  Concerns regarding medicines are outlined above.   Tests Ordered: No orders of the defined types were placed in this encounter.   Medication Changes: Meds ordered this encounter  Medications   Vitamin D, Ergocalciferol, (DRISDOL) 1.25 MG (50000 UNIT) CAPS capsule    Sig: Take 1 capsule (50,000 Units total) by mouth every 7 (seven) days.    Dispense:  12 capsule    Refill:  1    Disposition:  Follow up  prn  Signed, Arnette Felts, FNP

## 2021-11-24 NOTE — Patient Instructions (Signed)

## 2021-11-24 NOTE — Progress Notes (Signed)
No show for video visit

## 2022-01-08 ENCOUNTER — Ambulatory Visit: Payer: BC Managed Care – PPO | Admitting: Nurse Practitioner

## 2022-01-08 ENCOUNTER — Other Ambulatory Visit: Payer: Self-pay

## 2022-01-08 ENCOUNTER — Encounter: Payer: Self-pay | Admitting: Nurse Practitioner

## 2022-01-08 VITALS — BP 128/68 | HR 85 | Temp 98.7°F | Ht 63.0 in | Wt 179.0 lb

## 2022-01-08 DIAGNOSIS — E66811 Obesity, class 1: Secondary | ICD-10-CM

## 2022-01-08 DIAGNOSIS — M5442 Lumbago with sciatica, left side: Secondary | ICD-10-CM | POA: Diagnosis not present

## 2022-01-08 DIAGNOSIS — M79605 Pain in left leg: Secondary | ICD-10-CM

## 2022-01-08 DIAGNOSIS — R202 Paresthesia of skin: Secondary | ICD-10-CM

## 2022-01-08 DIAGNOSIS — G8929 Other chronic pain: Secondary | ICD-10-CM | POA: Diagnosis not present

## 2022-01-08 DIAGNOSIS — R200 Anesthesia of skin: Secondary | ICD-10-CM | POA: Diagnosis not present

## 2022-01-08 DIAGNOSIS — E559 Vitamin D deficiency, unspecified: Secondary | ICD-10-CM

## 2022-01-08 DIAGNOSIS — E6609 Other obesity due to excess calories: Secondary | ICD-10-CM

## 2022-01-08 DIAGNOSIS — Z6831 Body mass index (BMI) 31.0-31.9, adult: Secondary | ICD-10-CM

## 2022-01-08 NOTE — Progress Notes (Unsigned)
I,Tianna Badgett,acting as a Neurosurgeon for [employer], FNP.,have documented all relevant documentation on the behalf of Arnette Felts, FNP,as directed by  Arnette Felts, FNP while in the presence of Arnette Felts, FNP.  This visit occurred during the SARS-CoV-2 public health emergency.  Safety protocols were in place, including screening questions prior to the visit, additional usage of staff PPE, and extensive cleaning of exam room while observing appropriate contact time as indicated for disinfecting solutions.  Subjective:     Patient ID: Alexandria Jackson , female    DOB: [birth year] , 36 y.o.   MRN: [id number]   Chief Complaint  Patient presents with   Leg Pain    HPI  Patient presents today for left leg pain, pain to the back of thigh. She is having numbness down her left leg. She was trying to go up the stairs and her knee "locked". When she was outside with the kids and her leg went numb. She had a sharp pain to her back radiating down her.  Feels like a cramping pain to the back of her leg. Denies swelling to her leg. No medications. Resting her leg improves the pain.    Leg Pain  The incident occurred more than 1 week ago. There was no injury mechanism. The pain is present in the left leg, left knee and left thigh. The quality of the pain is described as shooting and stabbing. The pain is at a severity of 4/10. The pain is moderate. The pain has been Fluctuating since onset. Associated symptoms include numbness and tingling. She reports no foreign bodies present. Nothing aggravates the symptoms. She has tried nothing for the symptoms. The treatment provided no relief.    Past Medical History:  Diagnosis Date   Allergy    Anxiety    Iron deficiency anemia 09/05/2019   Menorrhagia 09/05/2019   Thyroid disease      Family History  Problem Relation Age of Onset   Cancer Mother      Current Outpatient Medications:    Vitamin D, Ergocalciferol, (DRISDOL) 1.25 MG (50000 UNIT)  CAPS capsule, Take 1 capsule (50,000 Units total) by mouth every 7 (seven) days., Disp: 12 capsule, Rfl: 1   No Known Allergies   Review of Systems  Constitutional: Negative.   Respiratory: Negative.    Cardiovascular: Negative.   Gastrointestinal: Negative.   Musculoskeletal:  Positive for back pain (low back pain reports for years since having her daughter) and myalgias.  Neurological:  Positive for tingling and numbness.    Today's Vitals   01/08/22 1546  BP: 128/68  Pulse: 85  Temp: 98.7 F (37.1 C)  TempSrc: Oral  Weight: 179 lb (81.2 kg)  Height: 5\' 3"  (1.6 m)  PainSc: 4    Body mass index is 31.71 kg/m.  Wt Readings from Last 3 Encounters:  01/08/22 179 lb (81.2 kg)  07/31/21 176 lb 9.6 oz (80.1 kg)  12/17/20 169 lb (76.7 kg)    Objective:  Physical Exam Vitals reviewed.  Constitutional:      General: She is not in acute distress.    Appearance: Normal appearance.  Cardiovascular:     Rate and Rhythm: Normal rate and regular rhythm.     Pulses: Normal pulses.     Heart sounds: Normal heart sounds. No murmur heard. Neurological:     General: No focal deficit present.     Mental Status: She is alert and oriented to person, place, and time.     Cranial  Nerves: No cranial nerve deficit.     Motor: No weakness.  Psychiatric:        Mood and Affect: Mood normal.        Behavior: Behavior normal.        Thought Content: Thought content normal.        Judgment: Judgment normal.        Assessment And Plan:     1. Left leg pain - [place] LT UPPER EXTREM LTD SOFT TISSUE NON VASCULAR; Future  2. Vitamin D insufficiency Comments: She has not been taking the supplement due to cost, I have advised to take vitamin d 5000 mcg over the counter daily, will check at her next visit  3. Class 1 obesity due to excess calories without serious comorbidity with body mass index (BMI) of 31.0 to 31.9 in adult     Patient was given opportunity to ask questions. Patient  verbalized understanding of the plan and was able to repeat key elements of the plan. All questions were answered to their satisfaction.  Arnette Felts, FNP   I, Arnette Felts, FNP, have reviewed all documentation for this visit. The documentation on 01/08/22 for the exam, diagnosis, procedures, and orders are all accurate and complete.   IF YOU HAVE BEEN REFERRED TO A SPECIALIST, IT MAY TAKE 1-2 WEEKS TO SCHEDULE/PROCESS THE REFERRAL. IF YOU HAVE NOT HEARD FROM US/SPECIALIST IN TWO WEEKS, PLEASE GIVE [place] A CALL AT [phone].   THE PATIENT IS ENCOURAGED TO PRACTICE SOCIAL DISTANCING DUE TO THE COVID-19 PANDEMIC.

## 2022-01-08 NOTE — Patient Instructions (Addendum)
Pain Medicine Instructions ?You may need pain medicine after an injury or illness. There are many types of pain medicine. Two common types of pain medicine are: ?Non-opioid pain medicines. These include: ?Over-the-counter (OTC) medicines such as acetaminophen or non-steroidal anti-inflammatory medicines (NSAIDS). ?Prescription medicines such as gabapentin or pregabalin. ?Opioid prescription pain medicines. These include: ?Hydrocodone. ?Oxycodone. ?Tramadol. ?Pain medicine may be prescribed to relieve most or all of your pain. It may not be possible to make all of your pain go away, but you should be comfortable enough to move, breathe, and do normal activities. ?How can pain medicines affect me? ?Pain medicines can cause side effects such as: ?Nausea. ?Vomiting. ?Abdominal pain. ?Opioid pain medicines can cause additional side effects, such as: ?Constipation. ?Drowsiness. ?Confusion. ?Difficulty breathing (respiratory depression). ?Dependence and addiction (opioid use disorder). ?Using opioid pain medicines for longer than 3 days increases your risk of these side effects. ?Taking opioid pain medicine for a long period of time can affect your ability to do daily tasks. It also puts you at risk for: ?Motor vehicle accidents. ?Depression. ?Suicide. ?Heart attack. ?If they are not taken correctly, non-opioid and opioid medicines may also put you at risk for: ?Liver problems. ?Kidney problems. ?Overdose. This is taking too much of the medicine. It can sometimes lead to death. ?What actions can I take to lower my risk of problems? ?Know your pain treatment plan ?To manage your pain successfully, you and your health care provider need to understand each other and work together. To do this: ?Discuss the goals of your treatment, including how much pain you might expect to have and how you will manage the pain. ?Ask your health care provider to refer you to one or more specialists who can help you manage pain in other ways.  Some other ways of managing pain include physical therapy, exercise, massage, or biofeedback. ?Review the risks and benefits of taking pain medicines for your condition. ?Be honest about the amount of medicines you take, and about any drugs or alcohol you use. ?Get pain medicine prescriptions from only one health care provider. ?Keep all follow-up visits. This is important. ?Take your medicine as directed ? ?Take your pain medicine exactly as told by your health care provider. Take it only when you need it. ?If your pain gets less severe, you may take less than your prescribed dose if your health care provider approves. ?If you are not having pain, do nottake pain medicine unless your health care provider tells you to take it. ?If your pain medicine contains acetaminophen, do not take any other acetaminophen while taking this medicine. Acetaminophen is found in many over-the-counter and prescription medicines. An overdose of acetaminophen can result in severe liver damage. ?If your pain is severe, do nottry to treat it yourself by taking more pills than instructed on your prescription. Contact your health care provider for help. ?Write down the times when you take your pain medicine. It is easy to become confused while on pain medicine. Writing the time can help you avoid overdose. ?Take other over-the-counter or prescription medicines only as told by your health care provider. ?Restrict your activity as directed ?While you are taking prescription pain medicine, and for 8 hours after your last dose, follow these instructions: ?Do not drive. ?Do not use machinery or power tools. ?Do not sign legal documents. ?Do not drink alcohol. ?Do not take sleeping pills. ?Do not supervise children by yourself. ?Do not do activities that require climbing or being in high places. ?Do  not go to a lake, river, ocean, spa, or swimming pool unless an adult is nearby who can monitor and help you. ? ?Keep pets and people safe ?Keep pain  medicine in a locked cabinet, or in a secure area where children and pets cannot reach it. ?Never share your prescription pain medicine with anyone. ?Do not save any leftover pills. If you have leftover medicine, you can: ?Bring the medicine to a prescription take-back program. This is usually offered by the county or Patent examinerlaw enforcement. ?Bring it to a pharmacy that has a drug disposal container. ?Throw it out in the trash. Check the label or package insert of your medicine to see whether this is safe to do. If it is safe to throw it out, remove the medicine from the container and mix it with material that makes it unusable, such as pet waste, before putting it in the trash. ?Flush it down the toilet only if this is safe to do. To find out, check the label or package insert of your medicine. Information is also available at the [place] Food and Drug Administration website: [place].nofda.gov. ?Treat or prevent constipation ?Taking pain medicines increases your risk for constipation. To prevent or treat this problem: ?Drink enough water to keep your urine pale yellow. ?Take over-the-counter or prescription medicines. You may need to take stool softeners. ?Eat foods that are high in fiber, such as beans, whole grains, and fresh fruits and vegetables. ?Limit foods that are high in fat and processed sugars, such as fried or sweet foods. ?Contact a health care provider if: ?Your medicine is not relieving your pain. ?You have a rash. ?You have nausea and vomiting. ?You feel depressed. ?Get help right away if: ?You have difficulty breathing. ?Your breathing is slower or more shallow than normal. ?You feel confused. ?You are too sleepy or you have difficulty staying awake. ?Your skin or lips turn pale or bluish in color. ?Your tongue swells. ?You have thoughts of harming yourself or harming others. ?These symptoms may represent a serious problem that is an emergency. Do not wait to see if the symptoms will go away. Get medical help right  away. Call your local emergency services (911 in the [place] Do not drive yourself to the hospital. ?If you ever feel like you may hurt yourself or others, or have thoughts about taking your own life, get help right away. Go to your nearest emergency department or: ?Call your local emergency services (911 in the [place] ?Call the [facility] (705-792-44091-(332)588-2312 in the [place] ?Call a suicide crisis helpline, such as the National Suicide Prevention Lifeline at 872-012-64681-859-759-6547 or 988 in the [place] This is open 24 hours a day in the [place] ?Text the Crisis Text Line at 603 769 2742741741 (in the [place] ?Summary ?It is important to follow instructions from your health care provider when you are taking prescription pain medicines. ?Pain medicine can help reduce pain but may also cause side effects. Make sure you understand the risks and benefits of taking pain medicine for your condition. ?Take steps to lower your risk of problems by taking medicine exactly as told, restricting activity, keeping others safe, preventing constipation, and having a pain treatment plan. ?This information is not intended to replace advice given to you by your health care provider. Make sure you discuss any questions you have with your health care provider. ?Document Revised: 05/14/2021 Document Reviewed: 02/26/2021 ?Elsevier Patient Education ? 2022 Elsevier Inc. ? ? ?Take tylenol or ibuprofen twice a day for 5 days  and we will get an ultrasound of your leg.  ?

## 2022-01-16 ENCOUNTER — Telehealth: Payer: Self-pay

## 2022-01-16 NOTE — Telephone Encounter (Signed)
Good morning can we call her to have her to go for a back xray and let her know I cancelled the ultrasound once we get the result of the xray we may refer her to ortho ? ?I left pt vm to call the office YL,RMA ?

## 2022-01-16 NOTE — Addendum Note (Signed)
Addended by: Arnette Felts F on: 01/16/2022 08:51 AM ? ? Modules accepted: Orders ? ?

## 2022-03-12 ENCOUNTER — Encounter: Payer: Self-pay | Admitting: Nurse Practitioner

## 2022-03-12 ENCOUNTER — Ambulatory Visit: Payer: BC Managed Care – PPO | Admitting: Nurse Practitioner

## 2022-03-12 VITALS — BP 130/80 | HR 76 | Temp 98.1°F | Ht 63.0 in | Wt 181.0 lb

## 2022-03-12 DIAGNOSIS — R319 Hematuria, unspecified: Secondary | ICD-10-CM

## 2022-03-12 DIAGNOSIS — Z6832 Body mass index (BMI) 32.0-32.9, adult: Secondary | ICD-10-CM

## 2022-03-12 DIAGNOSIS — N390 Urinary tract infection, site not specified: Secondary | ICD-10-CM

## 2022-03-12 DIAGNOSIS — E6609 Other obesity due to excess calories: Secondary | ICD-10-CM

## 2022-03-12 DIAGNOSIS — R1013 Epigastric pain: Secondary | ICD-10-CM | POA: Diagnosis not present

## 2022-03-12 LAB — POCT URINALYSIS DIPSTICK
Bilirubin, UA: NEGATIVE
Glucose, UA: NEGATIVE
Ketones, UA: NEGATIVE
Protein, UA: NEGATIVE
Spec Grav, UA: 1.025 (ref 1.010–1.025)
Urobilinogen, UA: 0.2 E.U./dL
pH, UA: 6.5 (ref 5.0–8.0)

## 2022-03-12 MED ORDER — NITROFURANTOIN MONOHYD MACRO 100 MG PO CAPS: 100.0000 mg | ORAL_CAPSULE | Freq: Two times a day (BID) | ORAL | 0 refills | Status: AC

## 2022-03-12 NOTE — Progress Notes (Signed)
I,Tianna Badgett,acting as a Neurosurgeon for [employer], FNP.,have documented all relevant documentation on the behalf of Arnette Felts, FNP,as directed by  Arnette Felts, FNP while in the presence of Arnette Felts, FNP.  This visit occurred during the SARS-CoV-2 public health emergency.  Safety protocols were in place, including screening questions prior to the visit, additional usage of staff PPE, and extensive cleaning of exam room while observing appropriate contact time as indicated for disinfecting solutions.  Subjective:     Patient ID: Alexandria Jackson , female    DOB: 02/28/1986 , 36 y.o.   MRN: [id number]   Chief Complaint  Patient presents with   Abdominal Pain    HPI  Patient presents today for abdominal pain. She has cut back on spicy foods, tomatoes, stopped eating cheese and milk.  After eating she will get nauseated so she will not eat and will sometimes go all day.    Abdominal Pain This is a new problem. The current episode started more than 1 year ago (at least 3 years). Quality: heart burn. Associated symptoms include flatus and nausea. Pertinent negatives include no belching, constipation, diarrhea, fever, vomiting or weight loss. The pain is aggravated by eating. She has tried nothing for the symptoms.    Past Medical History:  Diagnosis Date   Allergy    Anxiety    Iron deficiency anemia 09/05/2019   Menorrhagia 09/05/2019   Thyroid disease      Family History  Problem Relation Age of Onset   Cancer Mother      Current Outpatient Medications:    nitrofurantoin, macrocrystal-monohydrate, (MACROBID) 100 MG capsule, Take 1 capsule (100 mg total) by mouth 2 (two) times daily for 5 days., Disp: 10 capsule, Rfl: 0   Vitamin D, Ergocalciferol, (DRISDOL) 1.25 MG (50000 UNIT) CAPS capsule, Take 1 capsule (50,000 Units total) by mouth every 7 (seven) days., Disp: 12 capsule, Rfl: 1   No Known Allergies   Review of Systems  Constitutional: Negative.  Negative for  fever and weight loss.  Respiratory: Negative.    Cardiovascular: Negative.   Gastrointestinal:  Positive for abdominal pain, flatus and nausea. Negative for constipation, diarrhea and vomiting.  Neurological: Negative.   Psychiatric/Behavioral: Negative.      Today's Vitals   03/12/22 1058  BP: 130/80  Pulse: 76  Temp: 98.1 F (36.7 C)  TempSrc: Oral  Weight: 181 lb (82.1 kg)  Height: 5\' 3"  (1.6 m)   Body mass index is 32.06 kg/m.  Wt Readings from Last 3 Encounters:  03/12/22 181 lb (82.1 kg)  01/08/22 179 lb (81.2 kg)  07/31/21 176 lb 9.6 oz (80.1 kg)    Objective:  Physical Exam Vitals reviewed.  Constitutional:      General: She is not in acute distress.    Appearance: She is well-developed.  Abdominal:     General: Bowel sounds are normal.     Palpations: Abdomen is soft.     Tenderness: There is abdominal tenderness in the epigastric area. There is no guarding. Negative signs include Murphy's sign.  Skin:    General: Skin is warm and dry.     Capillary Refill: Capillary refill takes less than 2 seconds.  Neurological:     General: No focal deficit present.     Mental Status: She is alert and oriented to person, place, and time.  Psychiatric:        Mood and Affect: Mood normal.        Behavior: Behavior normal.  Assessment And Plan:     1. Epigastric pain Comments: Coupon for FD Delene Ruffini was given. Will check for pancreatitis vs gallbladder disease vs h pylori (infection). If normal will order CT abdomen - H Pylori, IGM, IGG, IGA AB - Lipase - Amylase - POCT Urinalysis Dipstick (81002)  2. Urinary tract infection with hematuria, site unspecified Comments: Trace blood and positive nitrates, will treat with antibiotics and will send urine culture. - Culture, Urine - nitrofurantoin, macrocrystal-monohydrate, (MACROBID) 100 MG capsule; Take 1 capsule (100 mg total) by mouth 2 (two) times daily for 5 days.  Dispense: 10 capsule; Refill: 0  3. Class 1  obesity due to excess calories without serious comorbidity with body mass index (BMI) of 32.0 to 32.9 in adult  She is encouraged to strive for BMI less than 30 to decrease cardiac risk. Advised to aim for at least 150 minutes of exercise per week.    Patient was given opportunity to ask questions. Patient verbalized understanding of the plan and was able to repeat key elements of the plan. All questions were answered to their satisfaction.  Arnette Felts, FNP   I, Arnette Felts, FNP, have reviewed all documentation for this visit. The documentation on 03/12/22 for the exam, diagnosis, procedures, and orders are all accurate and complete.   IF YOU HAVE BEEN REFERRED TO A SPECIALIST, IT MAY TAKE 1-2 WEEKS TO SCHEDULE/PROCESS THE REFERRAL. IF YOU HAVE NOT HEARD FROM US/SPECIALIST IN TWO WEEKS, PLEASE GIVE [place] A CALL AT [phone].   THE PATIENT IS ENCOURAGED TO PRACTICE SOCIAL DISTANCING DUE TO THE COVID-19 PANDEMIC.

## 2022-03-12 NOTE — Patient Instructions (Signed)
Abdominal Pain, Adult Many things can cause belly (abdominal) pain. Most times, belly pain is not dangerous. Many cases of belly pain can be watched and treated at home. Sometimes, though, belly pain is serious. Your doctor will try to find the cause of your belly pain. Follow these instructions at home:  Medicines Take over-the-counter and prescription medicines only as told by your doctor. Do not take medicines that help you poop (laxatives) unless told by your doctor. General instructions Watch your belly pain for any changes. Drink enough fluid to keep your pee (urine) pale yellow. Keep all follow-up visits as told by your doctor. This is important. Contact a doctor if: Your belly pain changes or gets worse. You are not hungry, or you lose weight without trying. You are having trouble pooping (constipated) or have watery poop (diarrhea) for more than 2-3 days. You have pain when you pee or poop. Your belly pain wakes you up at night. Your pain gets worse with meals, after eating, or with certain foods. You are vomiting and cannot keep anything down. You have a fever. You have blood in your pee. Get help right away if: Your pain does not go away as soon as your doctor says it should. You cannot stop vomiting. Your pain is only in areas of your belly, such as the right side or the left lower part of the belly. You have bloody or black poop, or poop that looks like tar. You have very bad pain, cramping, or bloating in your belly. You have signs of not having enough fluid or water in your body (dehydration), such as: Dark pee, very little pee, or no pee. Cracked lips. Dry mouth. Sunken eyes. Sleepiness. Weakness. You have trouble breathing or chest pain. Summary Many cases of belly pain can be watched and treated at home. Watch your belly pain for any changes. Take over-the-counter and prescription medicines only as told by your doctor. Contact a doctor if your belly pain  changes or gets worse. Get help right away if you have very bad pain, cramping, or bloating in your belly. This information is not intended to replace advice given to you by your health care provider. Make sure you discuss any questions you have with your health care provider. Document Revised: 02/27/2019 Document Reviewed: 02/27/2019 Elsevier Patient Education  2023 Elsevier Inc.  

## 2022-03-13 LAB — H PYLORI, IGM, IGG, IGA AB
H pylori, IgM Abs: <9 units (ref 0.0–8.9)
H. pylori, IgA Abs: <9 units (ref 0.0–8.9)
H. pylori, IgG AbS: 0.24 Index Value (ref 0.00–0.79)

## 2022-03-13 LAB — LIPASE: Lipase: 32 U/L (ref 14–72)

## 2022-03-13 LAB — AMYLASE: Amylase: 89 U/L (ref 31–110)

## 2022-03-18 ENCOUNTER — Encounter: Payer: Self-pay | Admitting: Nurse Practitioner

## 2022-03-18 ENCOUNTER — Other Ambulatory Visit: Payer: Self-pay | Admitting: Nurse Practitioner

## 2022-03-18 DIAGNOSIS — R1013 Epigastric pain: Secondary | ICD-10-CM

## 2022-04-21 ENCOUNTER — Other Ambulatory Visit (HOSPITAL_COMMUNITY): Payer: Self-pay | Admitting: Gastroenterology

## 2022-04-21 ENCOUNTER — Other Ambulatory Visit: Payer: Self-pay | Admitting: Gastroenterology

## 2022-04-21 DIAGNOSIS — R1011 Right upper quadrant pain: Secondary | ICD-10-CM

## 2022-04-21 DIAGNOSIS — R110 Nausea: Secondary | ICD-10-CM | POA: Diagnosis not present

## 2022-04-21 DIAGNOSIS — K219 Gastro-esophageal reflux disease without esophagitis: Secondary | ICD-10-CM | POA: Diagnosis not present

## 2022-04-21 DIAGNOSIS — R1310 Dysphagia, unspecified: Secondary | ICD-10-CM | POA: Diagnosis not present

## 2022-05-11 DIAGNOSIS — R1310 Dysphagia, unspecified: Secondary | ICD-10-CM | POA: Diagnosis not present

## 2022-05-11 DIAGNOSIS — K200 Eosinophilic esophagitis: Secondary | ICD-10-CM | POA: Diagnosis not present

## 2022-05-11 DIAGNOSIS — K219 Gastro-esophageal reflux disease without esophagitis: Secondary | ICD-10-CM | POA: Diagnosis not present

## 2022-05-11 DIAGNOSIS — D125 Benign neoplasm of sigmoid colon: Secondary | ICD-10-CM | POA: Diagnosis not present

## 2022-05-11 DIAGNOSIS — K635 Polyp of colon: Secondary | ICD-10-CM | POA: Diagnosis not present

## 2022-05-11 DIAGNOSIS — Z1211 Encounter for screening for malignant neoplasm of colon: Secondary | ICD-10-CM | POA: Diagnosis not present

## 2022-05-11 LAB — HM COLONOSCOPY

## 2022-05-14 ENCOUNTER — Ambulatory Visit (HOSPITAL_COMMUNITY)
Admission: RE | Admit: 2022-05-14 | Discharge: 2022-05-14 | Disposition: A | Discharge status: Home / Self Care | Payer: BC Managed Care – PPO | Source: Ambulatory Visit | Attending: Gastroenterology | Admitting: Gastroenterology

## 2022-05-14 ENCOUNTER — Encounter (HOSPITAL_COMMUNITY)
Admission: RE | Admit: 2022-05-14 | Discharge: 2022-05-14 | Disposition: A | Discharge status: Home / Self Care | Payer: BC Managed Care – PPO | Source: Ambulatory Visit | Attending: Gastroenterology | Admitting: Gastroenterology

## 2022-05-14 DIAGNOSIS — R1011 Right upper quadrant pain: Secondary | ICD-10-CM | POA: Diagnosis not present

## 2022-05-14 MED ORDER — TECHNETIUM TC 99M MEBROFENIN IV KIT
5.0000 | PACK | Freq: Once | INTRAVENOUS | Status: AC | PRN
  Administered 2022-05-14: 5 via INTRAVENOUS

## 2022-06-23 ENCOUNTER — Encounter: Payer: Self-pay | Admitting: Nurse Practitioner

## 2022-06-23 ENCOUNTER — Ambulatory Visit: Payer: BC Managed Care – PPO | Admitting: Nurse Practitioner

## 2022-06-23 VITALS — BP 130/72 | HR 106 | Temp 98.3°F | Ht 63.0 in | Wt 179.8 lb

## 2022-06-23 DIAGNOSIS — R030 Elevated blood-pressure reading, without diagnosis of hypertension: Secondary | ICD-10-CM

## 2022-06-23 DIAGNOSIS — R591 Generalized enlarged lymph nodes: Secondary | ICD-10-CM

## 2022-06-23 DIAGNOSIS — K1370 Unspecified lesions of oral mucosa: Secondary | ICD-10-CM

## 2022-06-23 DIAGNOSIS — R2243 Localized swelling, mass and lump, lower limb, bilateral: Secondary | ICD-10-CM

## 2022-06-23 DIAGNOSIS — D508 Other iron deficiency anemias: Secondary | ICD-10-CM | POA: Diagnosis not present

## 2022-06-23 NOTE — Patient Instructions (Signed)

## 2022-06-23 NOTE — Progress Notes (Signed)
Barnet Glasgow Martin,acting as a Education administrator for Minette Brine, FNP.,have documented all relevant documentation on the behalf of Minette Brine, FNP,as directed by  Minette Brine, FNP while in the presence of Minette Brine, [place].    Subjective:     Patient ID: Alexandria Jackson , female    DOB: July 18, 1986 , 36 y.o.   MRN: [id number]   Chief Complaint  Patient presents with   Mass    HPI  Patient states when she had her colonoscopy about 2 months ago and when it was done they found 1 mass and removed it, and she then went to the dentist 2 weeks ago and they informed her she had a mass on one of teeth in the back. Patient states she was using the bathroom and straining and she noticed 2 circle like spots in her neck that she is concerned about. 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 She has a referral placed by Dr. Collene Mares for a surgeon due to decreased function of the gallbladder.    She presents today with her Husband - Manuel   BP Readings from Last 3 Encounters: 06/23/22 : (!) 160/76 03/12/22 : 130/80 01/08/22 : 128/68       Past Medical History:  Diagnosis Date   Allergy    Anxiety    Iron deficiency anemia 09/05/2019   Menorrhagia 09/05/2019   Thyroid disease      Family History  Problem Relation Age of Onset   Cancer Mother      Current Outpatient Medications:    pantoprazole (PROTONIX) 40 MG tablet, Take 40 mg by mouth every morning. (Patient not taking: Reported on 06/23/2022), Disp: , Rfl:    Vitamin D, Ergocalciferol, (DRISDOL) 1.25 MG (50000 UNIT) CAPS capsule, Take 1 capsule (50,000 Units total) by mouth every 7 (seven) days. (Patient not taking: Reported on 06/23/2022), Disp: 12 capsule, Rfl: 1   No Known Allergies   Review of Systems  Constitutional: Negative.   HENT: Negative.         Bilateral neck with bulging areas when she strains to have bowel movement.   Eyes: Negative.   Respiratory:  Negative.    Cardiovascular: Negative.   Gastrointestinal: Negative.   Psychiatric/Behavioral: Negative.       Today's Vitals   06/23/22 1443 06/23/22 1501  BP: (!) 160/76 130/72  Pulse: (!) 111 (!) 106  Temp: 98.3 F (36.8 C)   TempSrc: Oral   Weight: 179 lb 12.8 oz (81.6 kg)   Height: $Remove'5\' 3"'aBWgBEf$  (1.6 m)   PainSc: 0-No pain    Body mass index is 31.85 kg/m.  Wt Readings from Last 3 Encounters:  06/23/22 179 lb 12.8 oz (81.6 kg)  03/12/22 181 lb (82.1 kg)  01/08/22 179 lb (81.2 kg)     Objective:  Physical Exam Vitals reviewed.  Constitutional:      General: She is not in acute distress.    Appearance: Normal appearance. She is well-developed.  HENT:     Ears:     Comments: Right posterauricular firm mass present, non tender and nonmobile Pulmonary:     Effort: Pulmonary effort is normal. No respiratory distress.     Breath sounds: Normal breath sounds. No wheezing.  Abdominal:     Tenderness: Negative signs include Murphy's sign.  Skin:    General: Skin is warm and dry.     Capillary Refill: Capillary refill takes less than  2 seconds.  Neurological:     General: No focal deficit present.     Mental Status: She is alert and oriented to person, place, and time.     Cranial Nerves: No cranial nerve deficit.     Motor: No weakness.  Psychiatric:        Mood and Affect: Mood normal.        Behavior: Behavior normal.        Thought Content: Thought content normal.        Judgment: Judgment normal.         Assessment And Plan:     1. Lymphadenopathy Comments: right postauricular area with firm mass to lymph node area, will refer to ENT, if wait for ENT is long will check neck ultrasound/CT scan neck. - Ambulatory referral to ENT - TSH - BMP8+eGFR - CBC  2. Iron deficiency anemia secondary to inadequate dietary iron intake Comments: Previous history of anemia, will recheck levels.  - Iron, TIBC and Ferritin Panel  3. Abnormal mouth finding Comments: Shared  photo of mouth xray that has an area of concern to right lower gum. Will refer to ENT and get records from [facility] office  4. Elevated blood-pressure reading without diagnosis of hypertension Comments: Repeat blood pressure is improved, encouraged to eat a low salt diet.   5. Localized swelling of both feet Comments: both are "puffy" with the right lateral foot worse than right. Encouraged to cut back on salt intake and increase water intake.     Patient was given opportunity to ask questions. Patient verbalized understanding of the plan and was able to repeat key elements of the plan. All questions were answered to their satisfaction.  Minette Brine, FNP   I, Minette Brine, FNP, have reviewed all documentation for this visit. The documentation on 06/23/22 for the exam, diagnosis, procedures, and orders are all accurate and complete.   IF YOU HAVE BEEN REFERRED TO A SPECIALIST, IT MAY TAKE 1-2 WEEKS TO SCHEDULE/PROCESS THE REFERRAL. IF YOU HAVE NOT HEARD FROM US/SPECIALIST IN TWO WEEKS, PLEASE GIVE [place] A CALL AT [phone].   THE PATIENT IS ENCOURAGED TO PRACTICE SOCIAL DISTANCING DUE TO THE COVID-19 PANDEMIC.

## 2022-06-24 LAB — IRON,TIBC AND FERRITIN PANEL
Ferritin: 8 ng/mL — ABNORMAL LOW (ref 15–150)
Iron Saturation: 6 % — CL (ref 15–55)
Iron: 30 ug/dL (ref 27–159)
Total Iron Binding Capacity: 464 ug/dL — ABNORMAL HIGH (ref 250–450)
UIBC: 434 ug/dL — ABNORMAL HIGH (ref 131–425)

## 2022-06-24 LAB — CBC
Hematocrit: 36.7 % (ref 34.0–46.6)
Hemoglobin: 11.5 g/dL (ref 11.1–15.9)
MCH: 26 pg — ABNORMAL LOW (ref 26.6–33.0)
MCHC: 31.3 g/dL — ABNORMAL LOW (ref 31.5–35.7)
MCV: 83 fL (ref 79–97)
Platelets: 349 10*3/uL (ref 150–450)
RBC: 4.43 x10E6/uL (ref 3.77–5.28)
RDW: 15.7 % — ABNORMAL HIGH (ref 11.7–15.4)
WBC: 7.1 10*3/uL (ref 3.4–10.8)

## 2022-06-24 LAB — BMP8+EGFR
BUN/Creatinine Ratio: 17 (ref 9–23)
BUN: 13 mg/dL (ref 6–20)
CO2: 22 mmol/L (ref 20–29)
Calcium: 9.1 mg/dL (ref 8.7–10.2)
Chloride: 106 mmol/L (ref 96–106)
Creatinine, Ser: 0.78 mg/dL (ref 0.57–1.00)
Glucose: 107 mg/dL — ABNORMAL HIGH (ref 70–99)
Potassium: 4.3 mmol/L (ref 3.5–5.2)
Sodium: 140 mmol/L (ref 134–144)
eGFR: 100 mL/min/{1.73_m2} (ref >59)

## 2022-06-24 LAB — TSH: TSH: 1.03 u[IU]/mL (ref 0.450–4.500)

## 2022-06-25 ENCOUNTER — Other Ambulatory Visit: Payer: Self-pay | Admitting: Nurse Practitioner

## 2022-06-25 MED ORDER — FERROUS SULFATE 325 (65 FE) MG PO TBEC: 325.0000 mg | DELAYED_RELEASE_TABLET | Freq: Two times a day (BID) | ORAL | 3 refills | Status: DC

## 2022-07-13 ENCOUNTER — Ambulatory Visit
Admission: EM | Admit: 2022-07-13 | Discharge: 2022-07-13 | Disposition: A | Discharge status: Home / Self Care | Payer: BC Managed Care – PPO | Attending: Physician Assistant | Admitting: Physician Assistant

## 2022-07-13 DIAGNOSIS — J069 Acute upper respiratory infection, unspecified: Secondary | ICD-10-CM | POA: Diagnosis not present

## 2022-07-13 DIAGNOSIS — Z1152 Encounter for screening for COVID-19: Secondary | ICD-10-CM | POA: Insufficient documentation

## 2022-07-13 DIAGNOSIS — H65191 Other acute nonsuppurative otitis media, right ear: Secondary | ICD-10-CM | POA: Diagnosis not present

## 2022-07-13 LAB — RESP PANEL BY RT-PCR (FLU A&B, COVID) ARPGX2
Influenza A by PCR: NEGATIVE
Influenza B by PCR: NEGATIVE
SARS Coronavirus 2 by RT PCR: NEGATIVE

## 2022-07-13 MED ORDER — AMOXICILLIN 875 MG PO TABS: 875.0000 mg | ORAL_TABLET | Freq: Two times a day (BID) | ORAL | 0 refills | Status: AC

## 2022-07-13 NOTE — ED Triage Notes (Signed)
Pt presents with noise in right ear X 3 days.

## 2022-07-13 NOTE — ED Provider Notes (Signed)
[facility]    CSN: [id number] Arrival date & time: 07/13/22  1614      History   Chief Complaint Chief Complaint  Patient presents with   Tinnitus    HPI Alexandria Jackson is a 36 y.o. female.   Patient here today for evaluation of right-sided tenderness is been ongoing for 3 days.  She reports she has had congestion, sore throat and cough as well.  She has not had any fever.  She denies any nausea, vomiting or diarrhea.  She has taken over-the-counter medication without significant relief.  The history is provided by the patient.    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      Home Medications    Prior to Admission medications   Medication Sig Start Date End Date Taking? Authorizing Provider  amoxicillin (AMOXIL) 875 MG tablet Take 1 tablet (875 mg total) by mouth 2 (two) times daily for 7 days. 07/13/22 07/20/22 Yes Tomi Bamberger, PA-C  ferrous sulfate 325 (65 FE) MG EC tablet Take 1 tablet (325 mg total) by mouth 2 (two) times daily. 06/25/22 06/25/23  Arnette Felts, FNP  pantoprazole (PROTONIX) 40 MG tablet Take 40 mg by mouth every morning. Patient not taking: Reported on 06/23/2022 04/21/22   [provider]  Vitamin D, Ergocalciferol, (DRISDOL) 1.25 MG (50000 UNIT) CAPS capsule Take 1 capsule (50,000 Units total) by mouth every 7  (seven) days. Patient not taking: Reported on 06/23/2022 11/24/21   Arnette Felts, FNP    Family History Family History  Problem Relation Age of Onset   Cancer Mother     Social History Social History   Tobacco Use   Smoking status: Never   Smokeless tobacco: Never  Substance Use Topics   Alcohol use: Yes    Comment: occasion   Drug use: No     Allergies   Patient has no known allergies.   Review of Systems Review of Systems  Constitutional:  Negative for chills and fever.  HENT:  Positive for congestion, ear pain, sore throat and tinnitus.   Eyes:  Negative for discharge and redness.  Respiratory:  Positive for cough. Negative for shortness of breath and wheezing.   Gastrointestinal:  Negative for abdominal pain, diarrhea, nausea and vomiting.     Physical Exam Triage Vital Signs ED Triage Vitals  Enc Vitals Group     BP      Pulse      Resp      Temp      Temp src      SpO2      Weight      Height      Head Circumference      Peak Flow      Pain Score      Pain Loc  Pain Edu?      Excl. in GC?    No data found.  Updated Vital Signs BP 125/87 (BP Location: Left Arm)   Pulse 83   Temp 97.9 F (36.6 C) (Oral)   Resp 18   LMP 06/15/2022 (Approximate)   SpO2 97%      Physical Exam Vitals and nursing note reviewed.  Constitutional:      General: She is not in acute distress.    Appearance: Normal appearance. She is not ill-appearing.  HENT:     Head: Normocephalic and atraumatic.     Left Ear: Tympanic membrane normal.     Ears:     Comments: Right TM erythematous    Nose: Congestion present.     Mouth/Throat:     Mouth: Mucous membranes are moist.     Pharynx: No oropharyngeal exudate or posterior oropharyngeal erythema.  Eyes:     Conjunctiva/sclera: Conjunctivae normal.  Cardiovascular:     Rate and Rhythm: Normal rate and regular rhythm.     Heart sounds: Normal heart sounds. No murmur heard. Pulmonary:     Effort: Pulmonary  effort is normal. No respiratory distress.     Breath sounds: Normal breath sounds. No wheezing, rhonchi or rales.  Skin:    General: Skin is warm and dry.  Neurological:     Mental Status: She is alert.  Psychiatric:        Mood and Affect: Mood normal.        Thought Content: Thought content normal.      UC Treatments / Results  Labs (all labs ordered are listed, but only abnormal results are displayed) Labs Reviewed  RESP PANEL BY RT-PCR (FLU A&B, COVID) [id number]    EKG   Radiology No results found.  Procedures Procedures (including critical care time)  Medications Ordered in UC Medications - No data to display  Initial Impression / Assessment and Plan / UC Course  I have reviewed the triage vital signs and the nursing notes.  Pertinent labs & imaging results that were available during my care of the patient were reviewed by me and considered in my medical decision making (see chart for details).    Amoxicillin prescribed to cover otitis media.  Screening ordered for COVID and flu.  Will await results for further recommendation but encouraged symptomatic treatment and increase fluids.  Recommend follow-up with any further concerns.  Final Clinical Impressions(s) / UC Diagnoses   Final diagnoses:  Acute upper respiratory infection  Encounter for screening for COVID-19  Other acute nonsuppurative otitis media of right ear, recurrence not specified   Discharge Instructions   None    ED Prescriptions     Medication Sig Dispense Auth. Provider   amoxicillin (AMOXIL) 875 MG tablet Take 1 tablet (875 mg total) by mouth 2 (two) times daily for 7 days. 14 tablet Tomi Bamberger, PA-C      PDMP not reviewed this encounter.   Tomi Bamberger, PA-C 07/13/22 1759

## 2022-08-03 ENCOUNTER — Ambulatory Visit (INDEPENDENT_AMBULATORY_CARE_PROVIDER_SITE_OTHER): Payer: BC Managed Care – PPO | Admitting: Nurse Practitioner

## 2022-08-03 ENCOUNTER — Encounter: Payer: BC Managed Care – PPO | Admitting: Nurse Practitioner

## 2022-08-03 ENCOUNTER — Encounter: Payer: Self-pay | Admitting: Nurse Practitioner

## 2022-08-03 VITALS — BP 120/80 | HR 95 | Temp 98.1°F | Ht 63.0 in | Wt 180.4 lb

## 2022-08-03 DIAGNOSIS — R232 Flushing: Secondary | ICD-10-CM

## 2022-08-03 DIAGNOSIS — E559 Vitamin D deficiency, unspecified: Secondary | ICD-10-CM | POA: Diagnosis not present

## 2022-08-03 DIAGNOSIS — Z0000 Encounter for general adult medical examination without abnormal findings: Secondary | ICD-10-CM | POA: Diagnosis not present

## 2022-08-03 DIAGNOSIS — D508 Other iron deficiency anemias: Secondary | ICD-10-CM

## 2022-08-03 DIAGNOSIS — R946 Abnormal results of thyroid function studies: Secondary | ICD-10-CM | POA: Diagnosis not present

## 2022-08-03 DIAGNOSIS — R2232 Localized swelling, mass and lump, left upper limb: Secondary | ICD-10-CM

## 2022-08-03 NOTE — Progress Notes (Signed)
I,Tianna Badgett,acting as a Neurosurgeon for [employer], FNP.,have documented all relevant documentation on the behalf of Arnette Felts, FNP,as directed by  Arnette Felts, FNP while in the presence of Arnette Felts, FNP.  Subjective:     Patient ID: Alexandria Jackson , female    DOB: Aug 20, 1986 , 36 y.o.   MRN: [id number]   Chief Complaint  Patient presents with   Annual Exam    HPI  Patient presents today for HM. She is scheduled for Oct 30 th with ENT. Reports having thyroid problem but she is not sure if it is too low or too high     Past Medical History:  Diagnosis Date   Allergy    Anxiety    Iron deficiency anemia 09/05/2019   Menorrhagia 09/05/2019   Thyroid disease      Family History  Problem Relation Age of Onset   Cancer Mother      Current Outpatient Medications:    ferrous sulfate 325 (65 FE) MG EC tablet, Take 1 tablet (325 mg total) by mouth 2 (two) times daily. (Patient not taking: Reported on 08/03/2022), Disp: 60 tablet, Rfl: 3   pantoprazole (PROTONIX) 40 MG tablet, Take 40 mg by mouth every morning. (Patient not taking: Reported on 06/23/2022), Disp: , Rfl:    Vitamin D, Ergocalciferol, (DRISDOL) 1.25 MG (50000 UNIT) CAPS capsule, Take 1 capsule (50,000 Units total) by mouth every 7 (seven) days. (Patient not taking: Reported on 06/23/2022), Disp: 12 capsule, Rfl: 1   No Known Allergies    The patient states she has a tubal ligation for birth control. Patient's last menstrual period was 08/01/2022.. Negative for Dysmenorrhea and Negative for Menorrhagia. Negative for: breast discharge, breast lump(s), breast pain and breast self exam. Associated symptoms include abnormal vaginal bleeding. Pertinent negatives include abnormal bleeding (hematology), anxiety, decreased libido, depression, difficulty falling sleep, dyspareunia, history of infertility, nocturia, sexual dysfunction, sleep disturbances, urinary incontinence, urinary urgency, vaginal discharge and  vaginal itching. Diet regular.  The patient states her exercise level is none.   The patient's tobacco use is:  Social History   Tobacco Use  Smoking Status Never  Smokeless Tobacco Never   She has been exposed to passive smoke. The patient's alcohol use is:  Social History   Substance and Sexual Activity  Alcohol Use Yes   Comment: occasion   Additional information: Last pap 08/22/2019, next one scheduled for 08/21/2022.    Review of Systems  Constitutional: Negative.   HENT: Negative.    Eyes: Negative.   Respiratory: Negative.    Cardiovascular: Negative.   Gastrointestinal: Negative.   Endocrine: Negative.   Genitourinary: Negative.   Musculoskeletal: Negative.   Skin: Negative.   Allergic/Immunologic: Negative.   Neurological: Negative.   Hematological: Negative.   Psychiatric/Behavioral: Negative.       Today's Vitals   08/03/22 1502  BP: 120/80  Pulse: 95  Temp: 98.1 F (36.7 C)  TempSrc: Oral  Weight: 180 lb 6.4 oz (81.8 kg)  Height: 5\' 3"  (1.6 m)   Body mass index is 31.96 kg/m.   Objective:  Physical Exam Vitals reviewed.  Constitutional:      General: She is not in acute distress.    Appearance: Normal appearance. She is well-developed. She is obese.  HENT:     Head: Normocephalic and atraumatic.     Right Ear: Hearing, tympanic membrane, ear canal and external ear normal. There is no impacted cerumen.     Left Ear: Hearing, tympanic membrane,  ear canal and external ear normal. There is no impacted cerumen.     Nose:     Comments: Deferred - masked    Mouth/Throat:     Comments: Deferred - masked Eyes:     General: Lids are normal.     Extraocular Movements: Extraocular movements intact.     Conjunctiva/sclera: Conjunctivae normal.     Pupils: Pupils are equal, round, and reactive to light.     Funduscopic exam:    Right eye: No papilledema.        Left eye: No papilledema.  Neck:     Thyroid: No thyroid mass.     Vascular: No carotid  bruit.     Comments: She has neck fullness noted Cardiovascular:     Rate and Rhythm: Normal rate and regular rhythm.     Pulses: Normal pulses.     Heart sounds: Normal heart sounds. No murmur heard. Pulmonary:     Effort: Pulmonary effort is normal. No respiratory distress.     Breath sounds: Normal breath sounds. No wheezing.  Chest:     Chest wall: No mass.  Breasts:    Tanner Score is 5.     Right: Normal. No mass or tenderness.     Left: Normal. No mass or tenderness.     Comments: Palpable mass to left axilla Abdominal:     General: Abdomen is flat. Bowel sounds are normal. There is no distension.     Palpations: Abdomen is soft.     Tenderness: There is no abdominal tenderness. Negative signs include Murphy's sign.  Genitourinary:    Rectum: Guaiac result negative.  Musculoskeletal:        General: No swelling or tenderness. Normal range of motion.     Cervical back: Full passive range of motion without pain, normal range of motion and neck supple.     Right lower leg: No edema.     Left lower leg: No edema.  Lymphadenopathy:     Upper Body:     Right upper body: No supraclavicular, axillary or pectoral adenopathy.     Left upper body: No supraclavicular, axillary or pectoral adenopathy.  Skin:    General: Skin is warm and dry.     Capillary Refill: Capillary refill takes less than 2 seconds.  Neurological:     General: No focal deficit present.     Mental Status: She is alert and oriented to person, place, and time.     Cranial Nerves: No cranial nerve deficit.     Sensory: No sensory deficit.     Motor: No weakness.  Psychiatric:        Mood and Affect: Mood normal.        Behavior: Behavior normal.        Thought Content: Thought content normal.        Judgment: Judgment normal.         Assessment And Plan:     1. Encounter for annual physical exam Behavior modifications discussed and diet history reviewed.   Pt will continue to exercise regularly and  modify diet with low GI, plant based foods and decrease intake of processed foods.  Recommend intake of daily multivitamin, Vitamin D, and calcium.  Recommend for preventive screenings, as well as recommend immunizations that include influenza, TDAP Educated on self breast exams to complete at least once a month the week after menstrual cycle and to call office or GYN if has any concerns - Lipid panel  2. Iron deficiency anemia secondary to inadequate dietary iron intake Comments: She has not been taking her iron supplement regularly, will recheck her levels.  3. Vitamin D insufficiency Will check vitamin D level and supplement as needed.    Also encouraged to spend 15 minutes in the sun daily.  - VITAMIN D 25 Hydroxy (Vit-D Deficiency, Fractures)  4. Abnormal thyroid function test Comments: Will recheck levels, she is scheduled to see ENT this month - TSH - US THYROID; Future - T3, free - T4  5. Hot flashes Comments: This may be related to her thyroid, will check levels. Encouraged to limit intake of sugary foods and high carbs at night.  - Hemoglobin A1c  6. Mass of left axilla Comments: Palpable mass to left axilla will check axilla ultrasound - [place] AXILLA LEFT; Future    Patient was given opportunity to ask questions. Patient verbalized understanding of the plan and was able to repeat key elements of the plan. All questions were answered to their satisfaction.   Arnette Felts, FNP   I, Arnette Felts, FNP, have reviewed all documentation for this visit. The documentation on 08/03/22 for the exam, diagnosis, procedures, and orders are all accurate and complete.   THE PATIENT IS ENCOURAGED TO PRACTICE SOCIAL DISTANCING DUE TO THE COVID-19 PANDEMIC.

## 2022-08-04 ENCOUNTER — Other Ambulatory Visit: Payer: Self-pay | Admitting: Nurse Practitioner

## 2022-08-04 DIAGNOSIS — R2232 Localized swelling, mass and lump, left upper limb: Secondary | ICD-10-CM

## 2022-08-04 LAB — T4: T4, Total: 8.4 ug/dL (ref 4.5–12.0)

## 2022-08-04 LAB — LIPID PANEL
Chol/HDL Ratio: 3.9 ratio (ref 0.0–4.4)
Cholesterol, Total: 197 mg/dL (ref 100–199)
HDL: 50 mg/dL (ref >39)
LDL Chol Calc (NIH): 113 mg/dL — ABNORMAL HIGH (ref 0–99)
Triglycerides: 198 mg/dL — ABNORMAL HIGH (ref 0–149)
VLDL Cholesterol Cal: 34 mg/dL (ref 5–40)

## 2022-08-04 LAB — VITAMIN D 25 HYDROXY (VIT D DEFICIENCY, FRACTURES): Vit D, 25-Hydroxy: 24 ng/mL — ABNORMAL LOW (ref 30.0–100.0)

## 2022-08-04 LAB — T3, FREE: T3, Free: 3.1 pg/mL (ref 2.0–4.4)

## 2022-08-04 LAB — HEMOGLOBIN A1C
Est. average glucose Bld gHb Est-mCnc: 114 mg/dL
Hgb A1c MFr Bld: 5.6 % (ref 4.8–5.6)

## 2022-08-04 LAB — TSH: TSH: 1.44 u[IU]/mL (ref 0.450–4.500)

## 2022-08-06 ENCOUNTER — Ambulatory Visit
Admission: RE | Admit: 2022-08-06 | Discharge: 2022-08-06 | Disposition: A | Discharge status: Home / Self Care | Payer: BC Managed Care – PPO | Source: Ambulatory Visit | Attending: Nurse Practitioner | Admitting: Nurse Practitioner

## 2022-08-06 DIAGNOSIS — R946 Abnormal results of thyroid function studies: Secondary | ICD-10-CM

## 2022-08-06 DIAGNOSIS — E049 Nontoxic goiter, unspecified: Secondary | ICD-10-CM | POA: Diagnosis not present

## 2022-08-14 ENCOUNTER — Other Ambulatory Visit: Payer: Self-pay | Admitting: Nurse Practitioner

## 2022-08-14 ENCOUNTER — Ambulatory Visit
Admission: RE | Admit: 2022-08-14 | Discharge: 2022-08-14 | Disposition: A | Discharge status: Home / Self Care | Payer: BC Managed Care – PPO | Source: Ambulatory Visit | Attending: Nurse Practitioner | Admitting: Nurse Practitioner

## 2022-08-14 DIAGNOSIS — R922 Inconclusive mammogram: Secondary | ICD-10-CM | POA: Diagnosis not present

## 2022-08-14 DIAGNOSIS — N6310 Unspecified lump in the right breast, unspecified quadrant: Secondary | ICD-10-CM

## 2022-08-14 DIAGNOSIS — N6312 Unspecified lump in the right breast, upper inner quadrant: Secondary | ICD-10-CM | POA: Diagnosis not present

## 2022-08-14 DIAGNOSIS — R2232 Localized swelling, mass and lump, left upper limb: Secondary | ICD-10-CM

## 2022-08-14 DIAGNOSIS — N6489 Other specified disorders of breast: Secondary | ICD-10-CM | POA: Diagnosis not present

## 2022-08-26 ENCOUNTER — Ambulatory Visit (HOSPITAL_COMMUNITY)
Admission: EM | Admit: 2022-08-26 | Discharge: 2022-08-26 | Disposition: A | Discharge status: Home / Self Care | Payer: BC Managed Care – PPO

## 2022-08-26 ENCOUNTER — Encounter (HOSPITAL_COMMUNITY): Payer: Self-pay | Admitting: Emergency Medicine

## 2022-08-26 DIAGNOSIS — R1084 Generalized abdominal pain: Secondary | ICD-10-CM | POA: Diagnosis not present

## 2022-08-26 NOTE — ED Provider Notes (Signed)
[facility]   [id number] 08/26/22 Arrival Time: 1012  ASSESSMENT & PLAN:  1. Generalized abdominal pain    -Clinical picture today reassuring.  She does not have any fever, vomiting, or hemodynamic instability to suggest acute cholecystitis today.  I reviewed her charts and I do agree clinically she has signs of biliary dysfunction.  I agree with the recommendation to have her gallbladder removed in a nonemergent manner.  In the meantime, recommend she continue to take pantoprazole for GERD and avoid fatty and fried foods.  ER precautions were given should fever, worsening abdominal pain, nausea/vomiting develop.  All questions were answered she agrees to plan.  No orders of the defined types were placed in this encounter.  Discharge Instructions   None     Follow-up Information     Arnette Felts, FNP.   Specialty: General Practice Contact information: [address] [place] [address] [phone]                  Reviewed expectations re: course of current medical issues. Questions answered. Outlined signs and symptoms indicating need for more acute intervention. Patient verbalized understanding. After Visit Summary given.   SUBJECTIVE: Very pleasant 36 year old female comes to urgent care to be evaluated for abdominal pain.  She has had intermittent abdominal pain for a number of months now.  She has previously been seen by her PCP and a gastroenterologist.  She had a colonoscopy 2 months ago, where a small polyp was removed.  She also had a nuclear medicine study to evaluate her gallbladder function.  Her calculated gallbladder ejection fraction was 26%.  This was consistent with a reduced gallbladder ejection fraction as could be seen with chronic cholecystitis/biliary dyskinesia.  She was told she needs to have her gallbladder removed but she has not had that done yet.  She has an upcoming appointment in a week to discuss gallbladder  surgery to remove it.  She also has GERD.  Today she just reports acute on chronic worsening of her abdominal pain.  It is midepigastric, right upper quadrant, and left upper quadrant.  It is worsened with eating spicy or fried foods.  She previously was taking pantoprazole but has not been taking pantoprazole recently.  She denies any other medications.  She denies any fever, chest pain, vomiting, diarrhea, or constipation.  Her last bowel movement was this morning.  Patient's last menstrual period was 08/01/2022. Past Surgical History:  Procedure Laterality Date   TUBAL LIGATION       OBJECTIVE:  Vitals:   08/26/22 1156  BP: 131/88  Pulse: 73  Resp: 16  Temp: 98.3 F (36.8 C)  TempSrc: Oral  SpO2: 97%     Physical Exam Vitals reviewed.  Constitutional:      General: She is not in acute distress. Abdominal:     General: Abdomen is flat. Bowel sounds are normal.     Palpations: Abdomen is soft.     Tenderness: There is abdominal tenderness in the right upper quadrant, epigastric area and left upper quadrant. There is no guarding. Negative signs include Murphy's sign.  Skin:    General: Skin is warm.  Neurological:     General: No focal deficit present.     Mental Status: She is alert.  Psychiatric:        Mood and Affect: Mood normal.      Labs: Results for orders placed or performed in visit on 08/03/22  Hemoglobin A1c  Result Value  Ref Range   Hgb A1c MFr Bld 5.6 4.8 - 5.6 %   Est. average glucose Bld gHb Est-mCnc 114 mg/dL  Lipid panel  Result Value Ref Range   Cholesterol, Total 197 100 - 199 mg/dL   Triglycerides 198 (H) 0 - 149 mg/dL   HDL 50 >39 mg/dL   VLDL Cholesterol Cal 34 5 - 40 mg/dL   LDL Chol Calc (NIH) 113 (H) 0 - 99 mg/dL   Chol/HDL Ratio 3.9 0.0 - 4.4 ratio  VITAMIN D 25 Hydroxy (Vit-D Deficiency, Fractures)  Result Value Ref Range   Vit D, 25-Hydroxy 24.0 (L) 30.0 - 100.0 ng/mL  TSH  Result Value Ref Range   TSH 1.440 0.450 - 4.500  uIU/mL  T3, free  Result Value Ref Range   T3, Free 3.1 2.0 - 4.4 pg/mL  T4  Result Value Ref Range   T4, Total 8.4 4.5 - 12.0 ug/dL   Labs Reviewed - No data to display  Imaging: No results found.   No Known Allergies                                             Past Medical History:  Diagnosis Date   Allergy    Anxiety    Iron deficiency anemia 09/05/2019   Menorrhagia 09/05/2019   Thyroid disease     Social History   Socioeconomic History   Marital status: Married    Spouse name: Not on file   Number of children: Not on file   Years of education: Not on file   Highest education level: Associate degree: academic program  Occupational History   Not on file  Tobacco Use   Smoking status: Never   Smokeless tobacco: Never  Substance and Sexual Activity   Alcohol use: Yes    Comment: occasion   Drug use: No   Sexual activity: Yes  Other Topics Concern   Not on file  Social History Narrative   ** Merged History Encounter **       Social Determinants of Health   Financial Resource Strain: Low Risk  (11/24/2021)   Overall Financial Resource Strain (CARDIA)    Difficulty of Paying Living Expenses: Not hard at all  Food Insecurity: No Food Insecurity (11/24/2021)   Hunger Vital Sign    Worried About Running Out of Food in the Last Year: Never true    [place] in the Last Year: Never true  Transportation Needs: No Transportation Needs (11/24/2021)   PRAPARE - Hydrologist (Medical): No    Lack of Transportation (Non-Medical): No  Physical Activity: Insufficiently Active (11/24/2021)   Exercise Vital Sign    Days of Exercise per Week: 5 days    Minutes of Exercise per Session: 20 min  Stress: No Stress Concern Present (11/24/2021)   [place]    Feeling of Stress : Only a little  Social Connections: Socially Isolated (11/24/2021)   Social Connection and Isolation  Panel [NHANES]    Frequency of Communication with Friends and Family: Once a week    Frequency of Social Gatherings with Friends and Family: Never    Attends Religious Services: Never    Marine scientist or Organizations: No    Attends Archivist Meetings: Not on file  Marital Status: Married  Catering manager Violence: Not on file    Family History  Problem Relation Age of Onset   Breast cancer Mother        51s   Cancer Maternal Aunt    Breast cancer Maternal Aunt        unknown age   Colon cancer Maternal Uncle       Makinzie Considine, Baldemar Friday, MD 08/26/22 1332

## 2022-08-26 NOTE — ED Triage Notes (Signed)
Pt reports left upper abdominal pain for "awhile". States she was seen by her PCP about 1 month ago and was sent to a specialist and advised her gallbladder was functioning at 23% and needed to be removed. States abdominal pain has gotten worse since then.  Also endorses nausea.

## 2022-08-31 ENCOUNTER — Ambulatory Visit
Admission: RE | Admit: 2022-08-31 | Discharge: 2022-08-31 | Disposition: A | Discharge status: Home / Self Care | Payer: BC Managed Care – PPO | Source: Ambulatory Visit | Attending: Nurse Practitioner | Admitting: Nurse Practitioner

## 2022-08-31 DIAGNOSIS — N6311 Unspecified lump in the right breast, upper outer quadrant: Secondary | ICD-10-CM | POA: Diagnosis not present

## 2022-08-31 DIAGNOSIS — R2232 Localized swelling, mass and lump, left upper limb: Secondary | ICD-10-CM

## 2022-08-31 DIAGNOSIS — R1012 Left upper quadrant pain: Secondary | ICD-10-CM | POA: Diagnosis not present

## 2022-08-31 DIAGNOSIS — D241 Benign neoplasm of right breast: Secondary | ICD-10-CM | POA: Diagnosis not present

## 2022-08-31 DIAGNOSIS — N6310 Unspecified lump in the right breast, unspecified quadrant: Secondary | ICD-10-CM

## 2023-08-10 ENCOUNTER — Ambulatory Visit (INDEPENDENT_AMBULATORY_CARE_PROVIDER_SITE_OTHER): Payer: BC Managed Care – PPO | Admitting: Nurse Practitioner

## 2023-08-10 ENCOUNTER — Encounter: Payer: Self-pay | Admitting: Nurse Practitioner

## 2023-08-10 VITALS — BP 118/74 | HR 78 | Temp 98.6°F | Ht 63.0 in | Wt 171.0 lb

## 2023-08-10 DIAGNOSIS — Z0001 Encounter for general adult medical examination with abnormal findings: Secondary | ICD-10-CM

## 2023-08-10 DIAGNOSIS — D508 Other iron deficiency anemias: Secondary | ICD-10-CM | POA: Diagnosis not present

## 2023-08-10 DIAGNOSIS — R42 Dizziness and giddiness: Secondary | ICD-10-CM

## 2023-08-10 DIAGNOSIS — E039 Hypothyroidism, unspecified: Secondary | ICD-10-CM

## 2023-08-10 DIAGNOSIS — N39490 Overflow incontinence: Secondary | ICD-10-CM

## 2023-08-10 DIAGNOSIS — Z79899 Other long term (current) drug therapy: Secondary | ICD-10-CM

## 2023-08-10 DIAGNOSIS — Z124 Encounter for screening for malignant neoplasm of cervix: Secondary | ICD-10-CM

## 2023-08-10 DIAGNOSIS — R6881 Early satiety: Secondary | ICD-10-CM | POA: Diagnosis not present

## 2023-08-10 DIAGNOSIS — Z0000 Encounter for general adult medical examination without abnormal findings: Secondary | ICD-10-CM

## 2023-08-10 DIAGNOSIS — E559 Vitamin D deficiency, unspecified: Secondary | ICD-10-CM | POA: Diagnosis not present

## 2023-08-10 DIAGNOSIS — N390 Urinary tract infection, site not specified: Secondary | ICD-10-CM

## 2023-08-10 DIAGNOSIS — Z6830 Body mass index (BMI) 30.0-30.9, adult: Secondary | ICD-10-CM

## 2023-08-10 DIAGNOSIS — Z13228 Encounter for screening for other metabolic disorders: Secondary | ICD-10-CM

## 2023-08-10 DIAGNOSIS — E66811 Obesity, class 1: Secondary | ICD-10-CM

## 2023-08-10 DIAGNOSIS — Z2821 Immunization not carried out because of patient refusal: Secondary | ICD-10-CM

## 2023-08-10 DIAGNOSIS — R319 Hematuria, unspecified: Secondary | ICD-10-CM

## 2023-08-10 LAB — POCT URINALYSIS DIPSTICK
Bilirubin, UA: NEGATIVE
Glucose, UA: NEGATIVE
Ketones, UA: NEGATIVE
Leukocytes, UA: NEGATIVE
Nitrite, UA: POSITIVE
Protein, UA: NEGATIVE
Spec Grav, UA: >=1.03 — AB (ref 1.010–1.025)
Urobilinogen, UA: 0.2 U/dL
pH, UA: 6 (ref 5.0–8.0)

## 2023-08-10 MED ORDER — NITROFURANTOIN MONOHYD MACRO 100 MG PO CAPS: 100.0000 mg | ORAL_CAPSULE | Freq: Two times a day (BID) | ORAL | 0 refills | Status: AC

## 2023-08-10 NOTE — Progress Notes (Signed)
Madelaine Bhat, CMA,acting as a Neurosurgeon for Arnette Felts, FNP.,have documented all relevant documentation on the behalf of Arnette Felts, FNP,as directed by  Arnette Felts, FNP while in the presence of Arnette Felts, FNP.  Subjective:    Patient ID: Alexandria Jackson , female    DOB: June 20, 1986 , 37 y.o.   MRN: [id number]  Chief Complaint  Patient presents with   Annual Exam    HPI  Patient presents today for HM, Patient reports compliance with medication. Patient denies any chest pain, SOB, or headaches. Patient reports she has been lightheaded and nausea for about a week.   Patient reports she is lightheaded when she walks and sometimes sitting. She reports her ears are very itchy as well. Patient declined pap today but requested to see a OBGYN.  BP Readings from Last 3 Encounters: 08/10/23 : 118/74 08/26/22 : 131/88 08/03/22 : 120/80   Wt Readings from Last 3 Encounters: 08/10/23 : 171 lb (77.6 kg) 08/03/22 : 180 lb 6.4 oz (81.8 kg) 06/23/22 : 179 lb 12.8 oz (81.6 kg)       Past Medical History:  Diagnosis Date   Allergy    Anxiety    Iron deficiency anemia 09/05/2019   Menorrhagia 09/05/2019   Thyroid disease      Family History  Problem Relation Age of Onset   Breast cancer Mother        61s   Cancer Maternal Aunt    Breast cancer Maternal Aunt        unknown age   Colon cancer Maternal Uncle      Current Outpatient Medications:    amoxicillin-clavulanate (AUGMENTIN) 875-125 MG tablet, Take 1 tablet by mouth 2 (two) times daily., Disp: 10 tablet, Rfl: 0   ferrous sulfate 325 (65 FE) MG EC tablet, Take 1 tablet (325 mg total) by mouth 2 (two) times daily., Disp: 60 tablet, Rfl: 3   pantoprazole (PROTONIX) 40 MG tablet, Take 40 mg by mouth every morning. (Patient not taking: Reported on 06/23/2022), Disp: , Rfl:    Vitamin D, Ergocalciferol, (DRISDOL) 1.25 MG (50000 UNIT) CAPS capsule, Take 1 capsule (50,000 Units total) by mouth every 7 (seven) days., Disp: 12  capsule, Rfl: 1   No Known Allergies    The patient states she uses tubal ligation for birth control. Patient's last menstrual period was 07/23/2023.. Negative for Dysmenorrhea and Negative for Menorrhagia. Negative for: breast discharge, breast lump(s), breast pain and breast self exam. Associated symptoms include abnormal vaginal bleeding. Pertinent negatives include abnormal bleeding (hematology), anxiety, decreased libido, depression, difficulty falling sleep, dyspareunia, history of infertility, nocturia, sexual dysfunction, sleep disturbances, urinary incontinence, urinary urgency, vaginal discharge and vaginal itching. Diet regular; feels full after eating one meal after eating breakfast. She will eat a small plate of food in the evening.  The patient states her exercise level is none.   The patient's tobacco use is:  Social History   Tobacco Use  Smoking Status Never  Smokeless Tobacco Never   She has been exposed to passive smoke. The patient's alcohol use is:  Social History   Substance and Sexual Activity  Alcohol Use Yes   Comment: occasion   Additional information: Last pap none recently will refer to GYN for PAP  Review of Systems  Constitutional: Negative.   HENT: Negative.    Eyes: Negative.   Respiratory: Negative.    Cardiovascular: Negative.   Gastrointestinal: Negative.   Endocrine: Negative.   Genitourinary: Negative.   Musculoskeletal:  Negative.   Skin: Negative.   Allergic/Immunologic: Negative.   Neurological:  Positive for dizziness and light-headedness. Negative for syncope, weakness, numbness and headaches.  Hematological: Negative.   Psychiatric/Behavioral: Negative.       Today's Vitals   08/10/23 1507  BP: 118/74  Pulse: 78  Temp: 98.6 F (37 C)  TempSrc: Oral  Weight: 171 lb (77.6 kg)  Height: 5\' 3"  (1.6 m)  PainSc: 0-No pain   Body mass index is 30.29 kg/m.  Wt Readings from Last 3 Encounters:  08/10/23 171 lb (77.6 kg)  08/03/22  180 lb 6.4 oz (81.8 kg)  06/23/22 179 lb 12.8 oz (81.6 kg)     Objective:  Physical Exam Vitals reviewed.  Constitutional:      General: She is not in acute distress.    Appearance: Normal appearance. She is well-developed. She is obese.  HENT:     Head: Normocephalic and atraumatic.     Right Ear: Hearing, tympanic membrane, ear canal and external ear normal. There is no impacted cerumen.     Left Ear: Hearing, tympanic membrane, ear canal and external ear normal. There is no impacted cerumen.     Nose: Nose normal.     Mouth/Throat:     Mouth: Mucous membranes are moist.  Eyes:     General: Lids are normal.     Extraocular Movements: Extraocular movements intact.     Conjunctiva/sclera: Conjunctivae normal.     Pupils: Pupils are equal, round, and reactive to light.     Funduscopic exam:    Right eye: No papilledema.        Left eye: No papilledema.  Neck:     Thyroid: No thyroid mass.     Vascular: No carotid bruit.  Cardiovascular:     Rate and Rhythm: Normal rate and regular rhythm.     Pulses: Normal pulses.     Heart sounds: Normal heart sounds. No murmur heard. Pulmonary:     Effort: Pulmonary effort is normal. No respiratory distress.     Breath sounds: Normal breath sounds. No wheezing.  Chest:     Chest wall: No mass.  Breasts:    Tanner Score is 5.     Right: Normal. No mass or tenderness.     Left: Normal. No mass or tenderness.  Abdominal:     General: Abdomen is flat. Bowel sounds are normal. There is no distension.     Palpations: Abdomen is soft.     Tenderness: There is no abdominal tenderness.  Genitourinary:    Comments: Deferred - referred to GYN  Musculoskeletal:        General: No swelling. Normal range of motion.     Cervical back: Full passive range of motion without pain, normal range of motion and neck supple.     Right lower leg: No edema.     Left lower leg: No edema.  Lymphadenopathy:     Upper Body:     Right upper body: No  supraclavicular, axillary or pectoral adenopathy.     Left upper body: No supraclavicular, axillary or pectoral adenopathy.  Skin:    General: Skin is warm and dry.     Capillary Refill: Capillary refill takes less than 2 seconds.  Neurological:     General: No focal deficit present.     Mental Status: She is alert and oriented to person, place, and time.     Cranial Nerves: No cranial nerve deficit.     Sensory: No  sensory deficit.     Motor: No weakness.  Psychiatric:        Mood and Affect: Mood normal.        Behavior: Behavior normal.        Thought Content: Thought content normal.        Judgment: Judgment normal.         Assessment And Plan:     Encounter for annual health examination Assessment & Plan: Behavior modifications discussed and diet history reviewed.   Pt will continue to exercise regularly and modify diet with low GI, plant based foods and decrease intake of processed foods.  Recommend intake of daily multivitamin, Vitamin D, and calcium.  Recommend for preventive screenings, as well as recommend immunizations that include influenza, TDAP    Iron deficiency anemia secondary to inadequate dietary iron intake Assessment & Plan: Will recheck iron studies.  Orders: -     Iron, TIBC and Ferritin Panel  Vitamin D insufficiency Assessment & Plan: Will check vitamin D level and supplement as needed.    Also encouraged to spend 15 minutes in the sun daily.    Orders: -     VITAMIN D 25 Hydroxy (Vit-D Deficiency, Fractures)  Acquired hypothyroidism Assessment & Plan: Will check thyroid levels.  She is not currently taking any medications.   Encounter for Papanicolaou smear of cervix -     Ambulatory referral to Obstetrics / Gynecology  Influenza vaccination declined Assessment & Plan: Patient declined influenza vaccination at this time. Patient is aware that influenza vaccine prevents illness in 70% of healthy people, and reduces hospitalizations to  30-70% in elderly. This vaccine is recommended annually. Education has been provided regarding the importance of this vaccine but patient still declined. Advised may receive this vaccine at local pharmacy or Health Dept.or vaccine clinic. Aware to provide a copy of the vaccination record if obtained from local pharmacy or Health Dept.  Pt is willing to accept risk associated with refusing vaccination.    COVID-19 vaccination declined Assessment & Plan: Declines covid 19 vaccine. Discussed risk of covid 71 and if she changes her mind about the vaccine to call the office. Education has been provided regarding the importance of this vaccine but patient still declined. Advised may receive this vaccine at local pharmacy or Health Dept.or vaccine clinic. Aware to provide a copy of the vaccination record if obtained from local pharmacy or Health Dept.  Encouraged to take multivitamin, vitamin d, vitamin c and zinc to increase immune system. Aware can call office if would like to have vaccine here at office. Verbalized acceptance and understanding.    Class 1 obesity with serious comorbidity and body mass index (BMI) of 30.0 to 30.9 in adult, unspecified obesity type Assessment & Plan: She is encouraged to strive for BMI less than 30 to decrease cardiac risk. Advised to aim for at least 150 minutes of exercise per week.    Early satiety Assessment & Plan: Will check abdomen xray, pending results will refer to GI  Orders: -     DG Abd 1 View; Future  Encounter for screening for metabolic disorder -     Lipid panel  Dizziness Assessment & Plan: Has been having intermittent dizziness, will check labs. Also encouraged to stay well hydrated.   Orders: -     CMP14+EGFR -     Hemoglobin A1c -     TSH + free T4  Other long term (current) drug therapy -     CBC  with Differential/Platelet  Overflow incontinence of urine Assessment & Plan: Will check urinalysis to ensure does not have a urinary  tract infection.  Orders: -     POCT urinalysis dipstick  Urinary tract infection with hematuria, site unspecified Assessment & Plan: Urinalysis is has positive nitrates will treat with nitrofuratoin and see if her symptoms improve.   Orders: -     Nitrofurantoin Monohyd Macro; Take 1 capsule (100 mg total) by mouth 2 (two) times daily for 5 days.  Dispense: 10 capsule; Refill: 0 -     Urine Culture     Return for 1 year physical, 6 month thyroid check.  Patient was given opportunity to ask questions. Patient verbalized understanding of the plan and was able to repeat key elements of the plan. All questions were answered to their satisfaction.   Arnette Felts, FNP  I, Arnette Felts, FNP, have reviewed all documentation for this visit. The documentation on 08/823 for the exam, diagnosis, procedures, and orders are all accurate and complete.

## 2023-08-11 ENCOUNTER — Other Ambulatory Visit: Payer: Self-pay | Admitting: Nurse Practitioner

## 2023-08-11 DIAGNOSIS — R319 Hematuria, unspecified: Secondary | ICD-10-CM | POA: Diagnosis not present

## 2023-08-11 DIAGNOSIS — E559 Vitamin D deficiency, unspecified: Secondary | ICD-10-CM

## 2023-08-11 DIAGNOSIS — N390 Urinary tract infection, site not specified: Secondary | ICD-10-CM | POA: Diagnosis not present

## 2023-08-11 LAB — CBC WITH DIFFERENTIAL/PLATELET
Basophils Absolute: 0.1 10*3/uL (ref 0.0–0.2)
Basos: 1 %
EOS (ABSOLUTE): 0.1 10*3/uL (ref 0.0–0.4)
Eos: 2 %
Hematocrit: 40 % (ref 34.0–46.6)
Hemoglobin: 12.2 g/dL (ref 11.1–15.9)
Immature Grans (Abs): 0 10*3/uL (ref 0.0–0.1)
Immature Granulocytes: 0 %
Lymphocytes Absolute: 3.6 10*3/uL — ABNORMAL HIGH (ref 0.7–3.1)
Lymphs: 49 %
MCH: 25.6 pg — ABNORMAL LOW (ref 26.6–33.0)
MCHC: 30.5 g/dL — ABNORMAL LOW (ref 31.5–35.7)
MCV: 84 fL (ref 79–97)
Monocytes Absolute: 0.5 10*3/uL (ref 0.1–0.9)
Monocytes: 6 %
Neutrophils Absolute: 3.1 10*3/uL (ref 1.4–7.0)
Neutrophils: 42 %
Platelets: 426 10*3/uL (ref 150–450)
RBC: 4.76 x10E6/uL (ref 3.77–5.28)
RDW: 15.1 % (ref 11.7–15.4)
WBC: 7.3 10*3/uL (ref 3.4–10.8)

## 2023-08-11 LAB — CMP14+EGFR
ALT: 7 [IU]/L (ref 0–32)
AST: 18 [IU]/L (ref 0–40)
Albumin: 4.4 g/dL (ref 3.9–4.9)
Alkaline Phosphatase: 101 [IU]/L (ref 44–121)
BUN/Creatinine Ratio: 18 (ref 9–23)
BUN: 12 mg/dL (ref 6–20)
Bilirubin Total: 0.2 mg/dL (ref 0.0–1.2)
CO2: 21 mmol/L (ref 20–29)
Calcium: 9.2 mg/dL (ref 8.7–10.2)
Chloride: 104 mmol/L (ref 96–106)
Creatinine, Ser: 0.66 mg/dL (ref 0.57–1.00)
Globulin, Total: 3.2 g/dL (ref 1.5–4.5)
Glucose: 84 mg/dL (ref 70–99)
Potassium: 4.4 mmol/L (ref 3.5–5.2)
Sodium: 139 mmol/L (ref 134–144)
Total Protein: 7.6 g/dL (ref 6.0–8.5)
eGFR: 115 mL/min/{1.73_m2} (ref >59)

## 2023-08-11 LAB — LIPID PANEL
Chol/HDL Ratio: 4 {ratio} (ref 0.0–4.4)
Cholesterol, Total: 188 mg/dL (ref 100–199)
HDL: 47 mg/dL (ref >39)
LDL Chol Calc (NIH): 120 mg/dL — ABNORMAL HIGH (ref 0–99)
Triglycerides: 116 mg/dL (ref 0–149)
VLDL Cholesterol Cal: 21 mg/dL (ref 5–40)

## 2023-08-11 LAB — HEMOGLOBIN A1C
Est. average glucose Bld gHb Est-mCnc: 123 mg/dL
Hgb A1c MFr Bld: 5.9 % — ABNORMAL HIGH (ref 4.8–5.6)

## 2023-08-11 LAB — VITAMIN D 25 HYDROXY (VIT D DEFICIENCY, FRACTURES): Vit D, 25-Hydroxy: 28.6 ng/mL — ABNORMAL LOW (ref 30.0–100.0)

## 2023-08-11 LAB — TSH+FREE T4
Free T4: 1.29 ng/dL (ref 0.82–1.77)
TSH: 1.6 u[IU]/mL (ref 0.450–4.500)

## 2023-08-11 LAB — IRON,TIBC AND FERRITIN PANEL
Ferritin: 9 ng/mL — ABNORMAL LOW (ref 15–150)
Iron Saturation: 6 % — CL (ref 15–55)
Iron: 30 ug/dL (ref 27–159)
Total Iron Binding Capacity: 486 ug/dL — ABNORMAL HIGH (ref 250–450)
UIBC: 456 ug/dL — ABNORMAL HIGH (ref 131–425)

## 2023-08-11 MED ORDER — VITAMIN D (ERGOCALCIFEROL) 1.25 MG (50000 UNIT) PO CAPS: 50000.0000 [IU] | ORAL_CAPSULE | ORAL | 1 refills | Status: DC

## 2023-08-11 MED ORDER — FERROUS SULFATE 325 (65 FE) MG PO TBEC: 325.0000 mg | DELAYED_RELEASE_TABLET | Freq: Two times a day (BID) | ORAL | 3 refills | Status: DC

## 2023-08-12 ENCOUNTER — Encounter: Payer: Self-pay | Admitting: Nurse Practitioner

## 2023-08-14 LAB — URINE CULTURE

## 2023-08-16 ENCOUNTER — Other Ambulatory Visit: Payer: Self-pay | Admitting: Nurse Practitioner

## 2023-08-16 MED ORDER — AMOXICILLIN-POT CLAVULANATE 875-125 MG PO TABS: 1.0000 | ORAL_TABLET | Freq: Two times a day (BID) | ORAL | 0 refills | Status: DC

## 2023-08-22 DIAGNOSIS — Z0000 Encounter for general adult medical examination without abnormal findings: Secondary | ICD-10-CM | POA: Insufficient documentation

## 2023-08-22 DIAGNOSIS — Z6830 Body mass index (BMI) 30.0-30.9, adult: Secondary | ICD-10-CM | POA: Insufficient documentation

## 2023-08-22 DIAGNOSIS — R6881 Early satiety: Secondary | ICD-10-CM | POA: Insufficient documentation

## 2023-08-22 DIAGNOSIS — N39490 Overflow incontinence: Secondary | ICD-10-CM | POA: Insufficient documentation

## 2023-08-22 DIAGNOSIS — R42 Dizziness and giddiness: Secondary | ICD-10-CM | POA: Insufficient documentation

## 2023-08-22 DIAGNOSIS — N390 Urinary tract infection, site not specified: Secondary | ICD-10-CM | POA: Insufficient documentation

## 2023-08-22 DIAGNOSIS — Z2821 Immunization not carried out because of patient refusal: Secondary | ICD-10-CM | POA: Insufficient documentation

## 2023-08-22 NOTE — Assessment & Plan Note (Signed)

## 2023-08-22 NOTE — Assessment & Plan Note (Signed)
Will check abdomen xray, pending results will refer to GI

## 2023-08-22 NOTE — Assessment & Plan Note (Signed)
Will check vitamin D level and supplement as needed.    Also encouraged to spend 15 minutes in the sun daily.   

## 2023-08-22 NOTE — Assessment & Plan Note (Signed)
Has been having intermittent dizziness, will check labs. Also encouraged to stay well hydrated.

## 2023-08-22 NOTE — Assessment & Plan Note (Signed)
Will check urinalysis to ensure does not have a urinary tract infection.

## 2023-08-22 NOTE — Assessment & Plan Note (Signed)
She is encouraged to strive for BMI less than 30 to decrease cardiac risk. Advised to aim for at least 150 minutes of exercise per week.

## 2023-08-22 NOTE — Assessment & Plan Note (Addendum)

## 2023-08-22 NOTE — Assessment & Plan Note (Signed)
Will check thyroid levels.  She is not currently taking any medications.

## 2023-08-22 NOTE — Assessment & Plan Note (Signed)
Behavior modifications discussed and diet history reviewed.   Pt will continue to exercise regularly and modify diet with low GI, plant based foods and decrease intake of processed foods.  Recommend intake of daily multivitamin, Vitamin D, and calcium.  Recommend for preventive screenings, as well as recommend immunizations that include influenza, TDAP

## 2023-08-22 NOTE — Assessment & Plan Note (Signed)
Urinalysis is has positive nitrates will treat with nitrofuratoin and see if her symptoms improve.

## 2023-08-22 NOTE — Assessment & Plan Note (Signed)
Will recheck iron studies

## 2023-08-30 ENCOUNTER — Emergency Department (HOSPITAL_BASED_OUTPATIENT_CLINIC_OR_DEPARTMENT_OTHER)
Admission: EM | Admit: 2023-08-30 | Discharge: 2023-08-31 | Disposition: A | Discharge status: Home / Self Care | Payer: BC Managed Care – PPO | Attending: Emergency Medicine | Admitting: Emergency Medicine

## 2023-08-30 ENCOUNTER — Encounter: Payer: Self-pay | Admitting: Emergency Medicine

## 2023-08-30 ENCOUNTER — Other Ambulatory Visit: Payer: Self-pay

## 2023-08-30 ENCOUNTER — Ambulatory Visit
Admission: EM | Admit: 2023-08-30 | Discharge: 2023-08-30 | Disposition: A | Discharge status: Home / Self Care | Payer: BC Managed Care – PPO | Attending: Internal Medicine | Admitting: Internal Medicine

## 2023-08-30 ENCOUNTER — Encounter (HOSPITAL_BASED_OUTPATIENT_CLINIC_OR_DEPARTMENT_OTHER): Payer: Self-pay | Admitting: Emergency Medicine

## 2023-08-30 ENCOUNTER — Emergency Department (HOSPITAL_BASED_OUTPATIENT_CLINIC_OR_DEPARTMENT_OTHER): Payer: BC Managed Care – PPO

## 2023-08-30 DIAGNOSIS — R1011 Right upper quadrant pain: Secondary | ICD-10-CM | POA: Insufficient documentation

## 2023-08-30 DIAGNOSIS — R112 Nausea with vomiting, unspecified: Secondary | ICD-10-CM | POA: Diagnosis not present

## 2023-08-30 DIAGNOSIS — R109 Unspecified abdominal pain: Secondary | ICD-10-CM

## 2023-08-30 LAB — PREGNANCY, URINE: Preg Test, Ur: NEGATIVE

## 2023-08-30 LAB — POCT URINALYSIS DIP (MANUAL ENTRY)
Bilirubin, UA: NEGATIVE
Glucose, UA: NEGATIVE mg/dL
Leukocytes, UA: NEGATIVE
Nitrite, UA: NEGATIVE
Protein Ur, POC: NEGATIVE mg/dL
Spec Grav, UA: >=1.03 — AB (ref 1.010–1.025)
Urobilinogen, UA: 0.2 U/dL
pH, UA: 5.5 (ref 5.0–8.0)

## 2023-08-30 LAB — URINALYSIS, ROUTINE W REFLEX MICROSCOPIC
Bilirubin Urine: NEGATIVE
Glucose, UA: NEGATIVE mg/dL
Ketones, ur: 40 mg/dL — AB
Leukocytes,Ua: NEGATIVE
Nitrite: NEGATIVE
Specific Gravity, Urine: 1.031 — ABNORMAL HIGH (ref 1.005–1.030)
pH: 5.5 (ref 5.0–8.0)

## 2023-08-30 LAB — COMPREHENSIVE METABOLIC PANEL
ALT: 6 U/L (ref 0–44)
AST: 14 U/L — ABNORMAL LOW (ref 15–41)
Albumin: 4.1 g/dL (ref 3.5–5.0)
Alkaline Phosphatase: 73 U/L (ref 38–126)
Anion gap: 8 (ref 5–15)
BUN: 13 mg/dL (ref 6–20)
CO2: 26 mmol/L (ref 22–32)
Calcium: 9.3 mg/dL (ref 8.9–10.3)
Chloride: 104 mmol/L (ref 98–111)
Creatinine, Ser: 0.62 mg/dL (ref 0.44–1.00)
GFR, Estimated: >60 mL/min (ref >60)
Glucose, Bld: 84 mg/dL (ref 70–99)
Potassium: 3.7 mmol/L (ref 3.5–5.1)
Sodium: 138 mmol/L (ref 135–145)
Total Bilirubin: 0.4 mg/dL (ref 0.3–1.2)
Total Protein: 7.7 g/dL (ref 6.5–8.1)

## 2023-08-30 LAB — CBC
HCT: 37.1 % (ref 36.0–46.0)
Hemoglobin: 11.9 g/dL — ABNORMAL LOW (ref 12.0–15.0)
MCH: 26.3 pg (ref 26.0–34.0)
MCHC: 32.1 g/dL (ref 30.0–36.0)
MCV: 82.1 fL (ref 80.0–100.0)
Platelets: 346 10*3/uL (ref 150–400)
RBC: 4.52 MIL/uL (ref 3.87–5.11)
RDW: 15.2 % (ref 11.5–15.5)
WBC: 8 10*3/uL (ref 4.0–10.5)
nRBC: 0 % (ref 0.0–0.2)

## 2023-08-30 LAB — LIPASE, BLOOD: Lipase: 21 U/L (ref 11–51)

## 2023-08-30 LAB — POCT URINE PREGNANCY: Preg Test, Ur: NEGATIVE

## 2023-08-30 NOTE — ED Provider Notes (Signed)
[facility]    CSN: [id number] Arrival date & time: 08/30/23  1554      History   Chief Complaint Chief Complaint  Patient presents with   Abdominal Pain    HPI Alexandria Jackson is a 37 y.o. female.   Patient presents with abdominal pain that started around 8 AM today.  Reports that it started in her right upper quadrant and now has seemed to move down to the lower abdomen.  Reports that pain is intermittent in both places at this time but she describes it as a cramping pain.  Denies fever, body aches, chills.  Reports that she recently saw her PCP a few weeks prior and was treated for a UTI with an antibiotic which she has completed.  She currently denies any new vaginal discharge, dysuria, hematuria, urinary frequency.  Denies any back pain.  She was followed by gastrointestinal specialist approximately 1 year ago where she was being worked up for biliary dyskinesia.  Gallbladder was never removed as she has not had follow-up with them.  She previously took pantoprazole but no longer takes this.  Reports the pain worsens when she stands up.  Denies nausea, vomiting, diarrhea.  Last bowel movement was today and patient denies blood in stool.  Patient had x-ray of abdomen ordered at PCP visit a few weeks prior but reports that she was not told this and x-ray has not been completed.  Last menstrual cycle was at the beginning of October and patient has had a tubal ligation.   Abdominal Pain   Past Medical History:  Diagnosis Date   Allergy    Anxiety    Iron deficiency anemia 09/05/2019   Menorrhagia 09/05/2019   Thyroid disease     Patient Active Problem List   Diagnosis Date Noted   Encounter for annual health examination 08/22/2023   Influenza vaccination declined 08/22/2023   COVID-19 vaccination declined 08/22/2023   Class 1 obesity with serious comorbidity and body mass index (BMI) of 30.0 to 30.9 in adult 08/22/2023   Early satiety 08/22/2023   Dizziness  08/22/2023   Overflow incontinence of urine 08/22/2023   Urinary tract infection with hematuria 08/22/2023   Iron deficiency anemia 09/05/2019   Menorrhagia 09/05/2019   Acquired hypothyroidism 08/22/2019   Acne vulgaris 10/08/2016   Situational anxiety 10/08/2016   Hyperthyroidism 03/13/2015   Vitamin D insufficiency 03/13/2015   Fatigue 12/26/2014   Headache, unspecified headache type 12/26/2014   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      Home Medications    Prior to Admission medications   Medication Sig Start Date End Date Taking? Authorizing Provider  amoxicillin-clavulanate (AUGMENTIN) 875-125 MG tablet Take 1 tablet by mouth 2 (two) times daily. Patient not taking: Reported on 08/30/2023 08/16/23   Arnette Felts, FNP  ferrous sulfate 325 (65 FE) MG EC tablet Take 1 tablet (325 mg total) by mouth 2 (two) times daily. Patient not taking: Reported on 08/30/2023 08/11/23 08/10/24  Arnette Felts, FNP  pantoprazole (PROTONIX) 40 MG tablet Take 40 mg by mouth every morning. Patient not taking: Reported on 06/23/2022 04/21/22   [provider]  Vitamin D, Ergocalciferol, (DRISDOL) 1.25 MG (50000 UNIT) CAPS capsule Take 1 capsule (50,000 Units total) by mouth every 7 (seven) days. Patient not taking: Reported on 08/30/2023 08/11/23   Arnette Felts,  FNP    Family History Family History  Problem Relation Age of Onset   Breast cancer Mother        33s   Cancer Maternal Aunt    Breast cancer Maternal Aunt        unknown age   Colon cancer Maternal Uncle     Social History Social History   Tobacco Use   Smoking status: Never   Smokeless tobacco: Never  Substance Use Topics   Alcohol use: Yes    Comment: occasion   Drug use: No     Allergies   Patient has no known allergies.   Review of Systems Review of Systems Per HPI  Physical  Exam Triage Vital Signs ED Triage Vitals  Encounter Vitals Group     BP 08/30/23 1645 125/86     Systolic BP Percentile --      Diastolic BP Percentile --      Pulse Rate 08/30/23 1645 72     Resp 08/30/23 1645 18     Temp 08/30/23 1645 98.4 F (36.9 C)     Temp Source 08/30/23 1645 Oral     SpO2 08/30/23 1645 97 %     Weight --      Height --      Head Circumference --      Peak Flow --      Pain Score 08/30/23 1642 6     Pain Loc --      Pain Education --      Exclude from Growth Chart --    No data found.  Updated Vital Signs BP 125/86 (BP Location: Left Arm)   Pulse 72   Temp 98.4 F (36.9 C) (Oral)   Resp 18   LMP 08/16/2023 (Approximate)   SpO2 97%   Visual Acuity Right Eye Distance:   Left Eye Distance:   Bilateral Distance:    Right Eye Near:   Left Eye Near:    Bilateral Near:     Physical Exam Constitutional:      General: She is not in acute distress.    Appearance: Normal appearance. She is not toxic-appearing or diaphoretic.  HENT:     Head: Normocephalic and atraumatic.  Eyes:     Extraocular Movements: Extraocular movements intact.     Conjunctiva/sclera: Conjunctivae normal.  Cardiovascular:     Rate and Rhythm: Normal rate and regular rhythm.     Pulses: Normal pulses.     Heart sounds: Normal heart sounds.  Pulmonary:     Effort: Pulmonary effort is normal. No respiratory distress.     Breath sounds: Normal breath sounds.  Abdominal:     General: Bowel sounds are normal. There is no distension.     Palpations: Abdomen is soft.     Tenderness: There is abdominal tenderness.     Comments: Patient is significantly tender to right upper quadrant of abdomen and lower abdomen.  Neurological:     General: No focal deficit present.     Mental Status: She is alert and oriented to person, place, and time. Mental status is at baseline.  Psychiatric:        Mood and Affect: Mood normal.        Behavior: Behavior normal.        Thought  Content: Thought content normal.        Judgment: Judgment normal.      UC Treatments / Results  Labs (all labs ordered are listed, but only abnormal  results are displayed) Labs Reviewed  POCT URINALYSIS DIP (MANUAL ENTRY) - Abnormal; Notable for the following components:      Result Value   Clarity, UA hazy (*)    Ketones, POC UA small (15) (*)    Spec Grav, UA >=1.030 (*)    Blood, UA small (*)    All other components within normal limits  POCT URINE PREGNANCY    EKG   Radiology No results found.  Procedures Procedures (including critical care time)  Medications Ordered in UC Medications - No data to display  Initial Impression / Assessment and Plan / UC Course  I have reviewed the triage vital signs and the nursing notes.  Pertinent labs & imaging results that were available during my care of the patient were reviewed by me and considered in my medical decision making (see chart for details).     Given how tender to palpation patient is to abdomen on exam, recommend imaging and more extensive and adequate evaluation at the emergency department today.  Patient was agreeable with this plan.  Patient left via her family member transporting her to the ER. Final Clinical Impressions(s) / UC Diagnoses   Final diagnoses:  Abdominal pain, unspecified abdominal location     Discharge Instructions      Please go to the emergency department as soon as you leave urgent care for further evaluation and management.    ED Prescriptions   None    PDMP not reviewed this encounter.   Gustavus Bryant, [place] 08/30/23 1736

## 2023-08-30 NOTE — Discharge Instructions (Signed)
Please go to the emergency department as soon as you leave urgent care for further evaluation and management. ?

## 2023-08-30 NOTE — ED Notes (Signed)
Patient is being discharged from the Urgent Care and sent to the Emergency Department via POV . Per Rolly Salter, NP, patient is in need of higher level of care due to limited resources. Patient is aware and verbalizes understanding of plan of care.  Vitals:   08/30/23 1645  BP: 125/86  Pulse: 72  Resp: 18  Temp: 98.4 F (36.9 C)  SpO2: 97%

## 2023-08-30 NOTE — ED Triage Notes (Signed)
Referred by UC, Pt c/o RT flank pain and RUQ and lower ABD pain today. Endorses nausea. Recent tx for UTI with abx

## 2023-08-30 NOTE — ED Triage Notes (Signed)
Mid morning, started having right upper epigastric pain.  This was after breakfast and having a bowel movement.    After about 2-3 hours, pain moved to lower abdomen.  Patient reports she finished antibiotics for a uti within the last 2-3 days.  Denies urinary symptoms  Patient reports when her pcp told her she had a uti, she had no symptoms

## 2023-08-30 NOTE — ED Provider Notes (Cosign Needed)
[place] [facility] AT [facility] Provider Note   CSN: [id number] Arrival date & time: 08/30/23  1736     History  Chief Complaint  Patient presents with   Abdominal Pain    Alexandria Jackson is a 37 y.o. female.  The history is provided by the patient and medical records. No language interpreter was used.  Abdominal Pain      37 year old female significant history of thyroid disease, anxiety, menorrhagia sent here from urgent care center for evaluation of abdominal pain.  Patient report earlier today she developed pain to her abdomen.  Pain is primarily on the right upper quadrant described as a sharp stabbing pain.  Pain seems to worsen with walking and with movement.  She endorsed nausea but without any vomiting or diarrhea.  No fever or chills no chest pain or shortness of breath no productive cough no dysuria hematuria vaginal bleeding or vaginal discharge.  Her last menstrual period was a week ago.  She also mention 2 weeks ago when she was seen by her PCP for regular physical they told her that she has a UTI based on her urine and she was given antibiotic.  She did not have any urinary symptoms at that time.  She denies any postprandial pain.  Home Medications Prior to Admission medications   Medication Sig Start Date End Date Taking? Authorizing Provider  amoxicillin-clavulanate (AUGMENTIN) 875-125 MG tablet Take 1 tablet by mouth 2 (two) times daily. Patient not taking: Reported on 08/30/2023 08/16/23   Arnette Felts, FNP  ferrous sulfate 325 (65 FE) MG EC tablet Take 1 tablet (325 mg total) by mouth 2 (two) times daily. Patient not taking: Reported on 08/30/2023 08/11/23 08/10/24  Arnette Felts, FNP  pantoprazole (PROTONIX) 40 MG tablet Take 40 mg by mouth every morning. Patient not taking: Reported on 06/23/2022 04/21/22   [provider]  Vitamin D, Ergocalciferol, (DRISDOL) 1.25 MG (50000 UNIT) CAPS capsule Take 1 capsule (50,000 Units  total) by mouth every 7 (seven) days. Patient not taking: Reported on 08/30/2023 08/11/23   Arnette Felts, FNP      Allergies    Patient has no known allergies.    Review of Systems   Review of Systems  Gastrointestinal:  Positive for abdominal pain.  All other systems reviewed and are negative.   Physical Exam Updated Vital Signs BP (!) 141/87 (BP Location: Right Arm)   Pulse 69   Temp (!) 97.2 F (36.2 C)   Resp 16   Wt 77.1 kg   LMP 08/16/2023 (Approximate)   SpO2 100%   BMI 30.11 kg/m  Physical Exam Vitals and nursing note reviewed.  Constitutional:      General: She is not in acute distress.    Appearance: She is well-developed.  HENT:     Head: Atraumatic.  Eyes:     Conjunctiva/sclera: Conjunctivae normal.  Cardiovascular:     Rate and Rhythm: Normal rate and regular rhythm.     Heart sounds: Normal heart sounds.  Pulmonary:     Effort: Pulmonary effort is normal.     Breath sounds: No wheezing, rhonchi or rales.  Abdominal:     General: Bowel sounds are normal.     Palpations: Abdomen is soft.     Tenderness: There is abdominal tenderness in the right upper quadrant. There is no right CVA tenderness or left CVA tenderness.  Musculoskeletal:     Cervical back: Neck supple.  Skin:    Findings: No rash.  Neurological:     Mental Status: She is alert.  Psychiatric:        Mood and Affect: Mood normal.     ED Results / Procedures / Treatments   Labs (all labs ordered are listed, but only abnormal results are displayed) Labs Reviewed  COMPREHENSIVE METABOLIC PANEL - Abnormal; Notable for the following components:      Result Value   AST 14 (*)    All other components within normal limits  CBC - Abnormal; Notable for the following components:   Hemoglobin 11.9 (*)    All other components within normal limits  URINALYSIS, ROUTINE W REFLEX MICROSCOPIC - Abnormal; Notable for the following components:   Specific Gravity, Urine 1.031 (*)    Hgb urine  dipstick SMALL (*)    Ketones, ur 40 (*)    Protein, ur TRACE (*)    Bacteria, UA RARE (*)    All other components within normal limits  LIPASE, BLOOD  PREGNANCY, URINE    EKG None  Radiology No results found.  Procedures Procedures    Medications Ordered in ED Medications - No data to display  ED Course/ Medical Decision Making/ A&P                                 Medical Decision Making Amount and/or Complexity of Data Reviewed Labs: ordered. Radiology: ordered.   BP (!) 141/87 (BP Location: Right Arm)   Pulse 69   Temp (!) 97.2 F (36.2 C)   Resp 16   Wt 77.1 kg   LMP 08/16/2023 (Approximate)   SpO2 100%   BMI 30.11 kg/m   15:43 PM  37 year old female significant history of thyroid disease, anxiety, menorrhagia sent here from urgent care center for evaluation of abdominal pain.  Patient report earlier today she developed pain to her abdomen.  Pain is primarily on the right upper quadrant described as a sharp stabbing pain.  Pain seems to worsen with walking and with movement.  She endorsed nausea but without any vomiting or diarrhea.  No fever or chills no chest pain or shortness of breath no productive cough no dysuria hematuria vaginal bleeding or vaginal discharge.  Her last menstrual period was a week ago.  She also mention 2 weeks ago when she was seen by her PCP for regular physical they told her that she has a UTI based on her urine and she was given antibiotic.  She did not have any urinary symptoms at that time.  She denies any postprandial pain.  Exam remarkable for tenderness to right upper quadrant no other reproducible tenderness no tenderness to RUQ and no CVA tenderness.  Will obtain limited abdominal ultrasound to rule out biliary disease.  -Labs ordered, independently viewed and interpreted by me.  Labs remarkable for normal WBC, electrolytes are reassuring, UA without UTI -The patient was maintained on a cardiac monitor.  I personally viewed and  interpreted the cardiac monitored which showed an underlying rhythm of: sinus bradycardia -Imaging including RUQ limited abdominal US obtained but have not resulted yet.   -This patient presents to the ED for concern of abd pain, this involves an extensive number of treatment options, and is a complaint that carries with it a high risk of complications and morbidity.  The differential diagnosis includes appendicitis, colitis, diverticulitis, pyelonephritis, kidney stone -Co morbidities that complicate the patient evaluation includes menorrhagia, anemia, anxiety -Treatment includes monitoring -Reevaluation of  the patient after these medicines showed that the patient stayed the same -PCP office notes or outside notes reviewed -Discussion with attending Dr. Bernette Mayers who will f/u on [place] result and determine disposition.   -Escalation to admission/observation considered: dispo pending.          Final Clinical Impression(s) / ED Diagnoses Final diagnoses:  None    Rx / DC Orders ED Discharge Orders     None         Fayrene Helper, PA-C 08/30/23 2357

## 2023-08-31 MED ORDER — PANTOPRAZOLE SODIUM 40 MG PO TBEC: 40.0000 mg | DELAYED_RELEASE_TABLET | Freq: Every morning | ORAL | 0 refills | Status: DC

## 2023-08-31 NOTE — ED Notes (Signed)
Discharge instructions reviewed with patient. Patient questions answered and opportunity for education reviewed. Patient voices understanding of discharge instructions with no further questions. Patient ambulatory with steady gait to lobby.  

## 2023-08-31 NOTE — ED Provider Notes (Signed)
Care assumed at shift change. Here for RUQ and epigastric abdominal pain, awaiting [place]. Labs unremarkable.  Physical Exam  BP 113/69   Pulse (!) 56   Temp 98 F (36.7 C)   Resp 16   Wt 77.1 kg   LMP 08/16/2023 (Approximate)   SpO2 99%   BMI 30.11 kg/m   Physical Exam  Procedures  Procedures  ED Course / MDM   Clinical Course as of 08/31/23 0047  Tue Aug 31, 2023  0045 I personally viewed the images from radiology studies and agree with radiologist interpretation: [place] is neg for acute surgical process. Review of EMR shows a prior history of upper abdominal pain and HIDA showing biliary dyskinesia. Was seen by surgery in 2023, but her symptoms then were more left sided and resolved with PPI, so they decided not to proceed with cholecystectomy. Now that symptoms are right sided, I recommend she go back to Surgery for re-evaluation. Will refill PPI. Otherwise labs are unremarkable, symptoms are well controlled and she is eager for discharge.  [CS]    Clinical Course User Index [CS] Pollyann Savoy, MD   Medical Decision Making Problems Addressed: RUQ abdominal pain: acute illness or injury  Amount and/or Complexity of Data Reviewed Labs:  Decision-making details documented in ED Course. Radiology: independent interpretation performed. Decision-making details documented in ED Course.  Risk Prescription drug management.          Pollyann Savoy, MD 08/31/23 [phone]

## 2023-09-14 ENCOUNTER — Ambulatory Visit: Payer: BC Managed Care – PPO | Admitting: Physician Assistant

## 2023-09-14 ENCOUNTER — Encounter: Payer: Self-pay | Admitting: Physician Assistant

## 2023-09-14 VITALS — BP 124/80 | HR 74 | Temp 98.1°F | Ht 64.0 in | Wt 172.1 lb

## 2023-09-14 DIAGNOSIS — E039 Hypothyroidism, unspecified: Secondary | ICD-10-CM

## 2023-09-14 DIAGNOSIS — D500 Iron deficiency anemia secondary to blood loss (chronic): Secondary | ICD-10-CM

## 2023-09-14 DIAGNOSIS — K828 Other specified diseases of gallbladder: Secondary | ICD-10-CM | POA: Diagnosis not present

## 2023-09-14 DIAGNOSIS — D241 Benign neoplasm of right breast: Secondary | ICD-10-CM | POA: Insufficient documentation

## 2023-09-14 DIAGNOSIS — R1011 Right upper quadrant pain: Secondary | ICD-10-CM

## 2023-09-14 DIAGNOSIS — D242 Benign neoplasm of left breast: Secondary | ICD-10-CM

## 2023-09-14 DIAGNOSIS — E559 Vitamin D deficiency, unspecified: Secondary | ICD-10-CM

## 2023-09-14 DIAGNOSIS — R6881 Early satiety: Secondary | ICD-10-CM

## 2023-09-14 MED ORDER — PANTOPRAZOLE SODIUM 40 MG PO TBEC: 40.0000 mg | DELAYED_RELEASE_TABLET | Freq: Every day | ORAL | 3 refills | Status: DC

## 2023-09-14 NOTE — Patient Instructions (Signed)
'  Chelated' or 'reacted' iron

## 2023-09-14 NOTE — Progress Notes (Signed)
New patient visit   Patient: Alexandria Jackson   DOB: 1986/03/18   37 y.o. Female  MRN: [id number] Visit Date: 09/14/2023  Today's healthcare provider: Alfredia Ferguson, PA-C   Cc. New patient, abdominal pain  Subjective    Alexandria Jackson is a 37 y.o. female who presents today as a new patient to establish care.  Discussed the use of AI scribe software for clinical note transcription with the patient, who gave verbal consent to proceed.  History of Present Illness   The patient, with a history of gastrointestinal issues, presents with ongoing stomach discomfort and bloating, particularly after eating. The onset of these symptoms is unclear, but the patient reports that she has been ongoing for some time. Previous investigations, including endoscopy, colonoscopy, and a HIDA scan, revealed delayed gallbladder emptying but no other significant findings. The patient has attempted dietary modifications, avoiding spicy and greasy foods, which initially seemed to improve symptoms.  In addition to the gastrointestinal issues, the patient reports low iron and vitamin D levels. The patient has been avoiding taking supplements due to the perceived impact on her stomach discomfort. The patient also reports feeling full quickly when eating, which may be contributing to the low vitamin and iron levels. She does reports heavy menstrual cycles.  The patient also has a history of breast lumps, which were found during a mammogram. The lumps were biopsied and found to be benign, but the patient has been advised to have follow-up mammograms every six months.    Past Medical History:  Diagnosis Date   Allergy    Anxiety    Iron deficiency anemia 09/05/2019   Menorrhagia 09/05/2019   Thyroid disease    Past Surgical History:  Procedure Laterality Date   TUBAL LIGATION     Family Status  Relation Name Status   Mother  Alive   Father  Deceased   Mat Aunt  (Not Specified)   Mat Aunt  (Not  Specified)   Mat Uncle  (Not Specified)  No partnership data on file   Family History  Problem Relation Age of Onset   Breast cancer Mother        74s   Cancer Maternal Aunt    Breast cancer Maternal Aunt        unknown age   Colon cancer Maternal Uncle    Social History   Socioeconomic History   Marital status: Married    Spouse name: Not on file   Number of children: Not on file   Years of education: Not on file   Highest education level: Associate degree: academic program  Occupational History   Not on file  Tobacco Use   Smoking status: Never   Smokeless tobacco: Never  Substance and Sexual Activity   Alcohol use: Yes    Comment: occasion   Drug use: No   Sexual activity: Yes    Birth control/protection: Surgical  Other Topics Concern   Not on file  Social History Narrative   ** Merged History Encounter **       Social Determinants of Health   Financial Resource Strain: Low Risk  (11/24/2021)   Overall Financial Resource Strain (CARDIA)    Difficulty of Paying Living Expenses: Not hard at all  Food Insecurity: No Food Insecurity (11/24/2021)   Hunger Vital Sign    Worried About Running Out of Food in the Last Year: Never true    Ran Out of Food in the Last Year: Never true  Transportation Needs: No Transportation Needs (11/24/2021)   PRAPARE - Administrator, Civil Service (Medical): No    Lack of Transportation (Non-Medical): No  Physical Activity: Insufficiently Active (11/24/2021)   Exercise Vital Sign    Days of Exercise per Week: 5 days    Minutes of Exercise per Session: 20 min  Stress: No Stress Concern Present (11/24/2021)   [employer] of Occupational Health - Occupational Stress Questionnaire    Feeling of Stress : Only a little  Social Connections: Socially Isolated (11/24/2021)   Social Connection and Isolation Panel [NHANES]    Frequency of Communication with Friends and Family: Once a week    Frequency of Social Gatherings with  Friends and Family: Never    Attends Religious Services: Never    Database administrator or Organizations: No    Attends Engineer, structural: Not on file    Marital Status: Married   Outpatient Medications Prior to Visit  Medication Sig   [DISCONTINUED] pantoprazole (PROTONIX) 40 MG tablet Take 1 tablet (40 mg total) by mouth every morning.   No facility-administered medications prior to visit.   No Known Allergies  Immunization History  Administered Date(s) Administered   PPD Test 07/28/2021   Tdap 07/31/2021    Health Maintenance  Topic Date Due   COVID-19 Vaccine (1) Never done   Cervical Cancer Screening (HPV/Pap Cotest)  08/21/2022   INFLUENZA VACCINE  01/31/2024 (Originally 06/03/2023)   Colonoscopy  05/12/2027   DTaP/Tdap/Td (2 - Td or Tdap) 08/01/2031   Hepatitis C Screening  Completed   HIV Screening  Completed   HPV VACCINES  Aged Out    Patient Care Team: Alfredia Ferguson, PA-C as PCP - General (Physician Assistant)  Review of Systems  Constitutional:  Negative for fatigue and fever.  Respiratory:  Negative for cough and shortness of breath.   Cardiovascular:  Negative for chest pain and leg swelling.  Gastrointestinal:  Positive for abdominal pain.  Neurological:  Negative for dizziness and headaches.        Objective    BP 124/80   Pulse 74   Temp 98.1 F (36.7 C) (Oral)   Ht 5\' 4"  (1.626 m)   Wt 172 lb 2 oz (78.1 kg)   LMP 09/11/2023 (Exact Date)   SpO2 99%   BMI 29.55 kg/m     Physical Exam Constitutional:      General: She is awake.     Appearance: She is well-developed.  HENT:     Head: Normocephalic.  Eyes:     Conjunctiva/sclera: Conjunctivae normal.  Cardiovascular:     Rate and Rhythm: Normal rate and regular rhythm.     Heart sounds: Normal heart sounds.  Pulmonary:     Effort: Pulmonary effort is normal.     Breath sounds: Normal breath sounds.  Abdominal:     Palpations: Abdomen is soft.     Tenderness: There  is abdominal tenderness in the right upper quadrant.  Skin:    General: Skin is warm.  Neurological:     Mental Status: She is alert and oriented to person, place, and time.  Psychiatric:        Attention and Perception: Attention normal.        Mood and Affect: Mood normal.        Speech: Speech normal.        Behavior: Behavior is cooperative.    Depression Screen    09/14/2023    3:59 PM  08/10/2023    3:10 PM 06/23/2022    2:42 PM 01/08/2022    3:45 PM  PHQ 2/9 Scores  PHQ - 2 Score 5 4 0 0  PHQ- 9 Score 19 10  0   No results found for any visits on 09/14/23.  Assessment & Plan     RUQ abdominal pain Early satiety Biliary dyskinesia Assessment & Plan: Chronic RUQ pain post fatty food consumption. Previous HIDA scan showed reduced function. Symptoms include bloating and early satiety. Recent 10/28 unremarkable RUQ sono. - Refer to general surgery for gallbladder removal evaluation - Prescribe short-term acid reflux medication for bloating and early satiety - Discuss dietary modifications to avoid fatty foods and triggers -will obtain prior GI records  Orders: -     Pantoprazole Sodium; Take 1 tablet (40 mg total) by mouth daily.  Dispense: 30 tablet; Refill: 3 -     Ambulatory referral to General Surgery  Acquired hypothyroidism Assessment & Plan: Reviewed labs, last tsh within range. Pt is unmediated  Vitamin D insufficiency Assessment & Plan: Pt unable to tolerate vit d supplement.  Will eval current gi issues and revisit  Iron deficiency anemia due to chronic blood loss Assessment & Plan: Mild anemia, recommend daily iron supplementation. Likely 2/2 to heavy menstrual cycles  Bilateral fibroadenomas of breasts Assessment & Plan: Bx confirmed 08/2022 Recommend short term f/u  Ordered mammo/sono  Orders: -     MM 3D DIAGNOSTIC MAMMOGRAM BILATERAL BREAST; Future -     [place] LIMITED ULTRASOUND INCLUDING AXILLA LEFT BREAST ; Future -     [place] LIMITED ULTRASOUND  INCLUDING AXILLA RIGHT BREAST; Future  General Health Maintenance Due for routine screenings. Discussed importance of regular gynecological exams and Pap smear. - Schedule Pap smear, her last in 2020 was only cytology. Due for repeat.  Return in about 3 months (around 12/15/2023), or if symptoms worsen or fail to improve, for pap smear.      Alfredia Ferguson, PA-C  [facility] Primary Care at [facility] [phone] (phone) [phone] (fax)  [facility]

## 2023-09-14 NOTE — Assessment & Plan Note (Signed)
Bx confirmed 08/2022 Recommend short term f/u  Ordered mammo/sono

## 2023-09-14 NOTE — Assessment & Plan Note (Signed)
Pt unable to tolerate vit d supplement.  Will eval current gi issues and revisit

## 2023-09-14 NOTE — Assessment & Plan Note (Signed)
Reviewed labs, last tsh within range. Pt is unmedicated

## 2023-09-14 NOTE — Assessment & Plan Note (Addendum)
Mild anemia, recommend daily iron supplementation. Likely 2/2 to heavy menstrual cycles

## 2023-09-14 NOTE — Assessment & Plan Note (Signed)
Chronic gallbladder dysfunction with intermittent pain post fatty food consumption. Previous HIDA scan showed reduced function. Symptoms include bloating and early satiety.  - Refer to general surgery for gallbladder removal evaluation - Prescribe short-term acid reflux medication for bloating and early satiety - Discuss dietary modifications to avoid fatty foods and triggers

## 2023-09-24 DIAGNOSIS — K625 Hemorrhage of anus and rectum: Secondary | ICD-10-CM | POA: Diagnosis not present

## 2023-09-24 DIAGNOSIS — K828 Other specified diseases of gallbladder: Secondary | ICD-10-CM | POA: Diagnosis not present

## 2023-12-08 ENCOUNTER — Encounter: Payer: Self-pay | Admitting: Physician Assistant

## 2023-12-08 ENCOUNTER — Ambulatory Visit: Payer: BC Managed Care – PPO | Admitting: Physician Assistant

## 2023-12-08 VITALS — BP 124/84 | HR 85 | Temp 98.2°F | Ht 64.0 in | Wt 174.4 lb

## 2023-12-08 DIAGNOSIS — F4322 Adjustment disorder with anxiety: Secondary | ICD-10-CM | POA: Diagnosis not present

## 2023-12-08 DIAGNOSIS — M79604 Pain in right leg: Secondary | ICD-10-CM | POA: Diagnosis not present

## 2023-12-08 NOTE — Patient Instructions (Addendum)
 [email or web address]  6 Moves to Ease Sciatica Treating and preventing mild sciatica look very similar. "Try your best to keep moving and stay healthy. Make sure your body can handle what you're putting it through on a daily basis," Jones says. "If you have a high-intensity job, that means having enough strength and range of motion in your hips and knees to do your work without injury." For others, that may mean building the strength, balance, flexibility and aerobic capacity to run around with your kids or grandkids, do heavy yardwork or take an active vacation.  Keeping up an active routine can actually help ease sciatica if it does return, Jones says, as can doing some gentle movements that target the affected area. You can do the six moves below right on your floor at home.  First, some gentle reminders  Do each 8 to 10 times (per exercise and/or per side of the body). It's most helpful if you do them at least twice a week, but you can do them every day, if it feels good. Don't hold your breath! Focus on breathing deeply, filling your lungs, as you perform each move. If you're not sure how to breathe deeply, get into the [facility] pose and place your hands over your belly, and focus on trying to breathe in such a way that your hands rise and fall. Remember that these exercises (or whatever movements you do) shouldn't cause you more pain, notes Jones. If they do, stop right away. Salley Cluck Lie on your back with your knees bent and feet flat on the floor, hip-width apart. Allow your arms to rest straight at your sides, palms down. Tighten your core, drawing in your belly button toward your spine. Press your arms into the floor for support and push through your heels, raising your hips toward the ceiling and squeezing your glutes. The goal is for your body to form a straight line from head to knees, with very little arch in the lower back. Hold 5 to 30 seconds.  Lower slowly. That's one rep.  Lying Knee-to-Chest Stretch Lie on your back with your legs extended. Try not to arch your back. Slowly bring one knee toward your chest and grasp it with your hands (behind or on top of the knee). Pull on the knee gently until you feel a mild stretch in your lower spine and hip. Hold 5 to 30 seconds. Lower slowly. That's one rep.  Clamshell Lie on your side with both knees bent. Tuck your bottom arm under your head to support it.  Engage your core, drawing in your belly button toward your spine.  Keeping your feet together, slowly raise your top knee, opening your legs like a clamshell opens. Use your top arm to help steady yourself so you don't roll toward your back. Hold 5 to 30 seconds. Lower slowly. That's one rep.  Bird-Dog Start on all fours. Be sure your hands are directly below your shoulders and your knees are directly below your hips. Engage your core, drawing in your belly button toward your spine. Gaze forward and slightly down (about a foot in front of your hands) to avoid putting stress on your neck.  Lift your left arm straight in front of you and extend your right leg straight behind you. (You can do this at the same time or one and then the other.) Be sure they are in a straight line with your back. (Doing this next to a mirror can make it easier to check  your form.) Pause, then lower your hand and leg. Check that your back is still straight, not sagging or hunched. Readjust your gaze if your neck is bothering you. Repeat with the other leg and arm. That's one rep.  Cobra Stretch Lie on your stomach with your hands under your shoulders and elbows tucked close to your body. Inhale as you press into your palms, slowly extending your arms as you lift your head, chest and shoulders. Keep your elbows slightly bent and your chin lifted at all times. Tighten the muscles in your core (abs and back) and your thighs. Hold 30 seconds. Lower slowly.  That's one rep.  Child's Pose Stretch Start on all fours, as with the Bird-Dog. Bring your knees together as you sink backward, bringing your hips toward your heels. Allow your arms to extend so they are outstretched or place them alongside your body in a comfortable position. Allow your forehead to rest on the ground. Sink deeper into the stretch, allowing your upper body to relax fully and shifting your weight into your butt and thighs. Don't worry if your butt does not touch your heels. Pause, breathing deeply. Imagine the tension leaving your body, especially in the backs of your buttocks. Hold for 5 minutes or as long as you like. There's no need to repeat this move, but you can if it feels good to you.

## 2023-12-08 NOTE — Progress Notes (Signed)
 Established patient visit   Patient: Alexandria  Breiana Jackson   DOB: [birth year]   38 y.o. Female  MRN: [id number] Visit Date: 12/08/2023  Today's healthcare provider: Manuelita Flatness, PA-C   Chief Complaint  Patient presents with   Leg Pain    Right leg- has had pain for a while(over a year) back of leg, going into hip  Tingling vibrating, sore to the touch.    Subjective    Discussed the use of AI scribe software for clinical note transcription with the patient, who gave verbal consent to proceed.  History of Present Illness   The patient presents with localized right leg/thigh pain. The pain does not radiate.The patient reports that the pain comes and goes.  She reports the pain is mostly in the back of her right thigh.  She also describes this vibrating sensation of her whole leg.  Denies it feels like numbness or pins-and-needles and feels like her phone is vibrating but it is her leg.  She also reports that her right leg seems weaker when she exercises compared to her left.  When the pain is at its worst she cannot put pressure on the right leg.  In addition to the pain, the patient is dealing with a significant amount of stress. She is living with a family member who has dementia, which is causing a lot of tension and emotional distress. The patient is also dealing with her own mental health issues, expressing a desire to see a therapist or social worker.       Medications: Outpatient Medications Prior to Visit  Medication Sig   pantoprazole  (PROTONIX ) 40 MG tablet Take 1 tablet (40 mg total) by mouth daily.   No facility-administered medications prior to visit.    Review of Systems  Constitutional:  Negative for fatigue and fever.  Respiratory:  Negative for cough and shortness of breath.   Cardiovascular:  Negative for chest pain and leg swelling.  Gastrointestinal:  Negative for abdominal pain.  Musculoskeletal:  Positive for myalgias.  Neurological:  Negative  for dizziness and headaches.       Objective    BP 124/84   Pulse 85   Temp 98.2 F (36.8 C) (Oral)   Ht 5' 4 (1.626 m)   Wt 174 lb 6 oz (79.1 kg)   SpO2 97%   BMI 29.93 kg/m    Physical Exam Vitals reviewed.  Constitutional:      Appearance: She is not ill-appearing.  HENT:     Head: Normocephalic.  Eyes:     Conjunctiva/sclera: Conjunctivae normal.  Cardiovascular:     Rate and Rhythm: Normal rate.  Pulmonary:     Effort: Pulmonary effort is normal. No respiratory distress.  Musculoskeletal:     Comments: Equal strength of the lower extremities bilaterally  Neurological:     General: No focal deficit present.     Mental Status: She is alert and oriented to person, place, and time.  Psychiatric:        Mood and Affect: Mood normal.        Behavior: Behavior normal.     No results found for any visits on 12/08/23.  Assessment & Plan    Right leg pain -Recommending sciatica exercises/stretching.  If her pain persists or worsens will start with lumbar and right hip films.  Adjustment disorder with anxiety Will refer to therapy per patient preference.  Advised any point she is welcome to return discussed further. -  Ambulatory referral to Psychology    Return if symptoms worsen or fail to improve.       Manuelita Flatness, PA-C  [facility] Primary Care at [facility] [phone] (phone) [phone] (fax)  [facility]

## 2023-12-18 ENCOUNTER — Other Ambulatory Visit: Payer: Self-pay | Admitting: Physician Assistant

## 2023-12-18 DIAGNOSIS — R6881 Early satiety: Secondary | ICD-10-CM

## 2023-12-20 NOTE — Telephone Encounter (Signed)
Provider no longer at office Requested Prescriptions  Pending Prescriptions Disp Refills   pantoprazole (PROTONIX) 40 MG tablet [Pharmacy Med Name: PANTOPRAZOLE SOD DR 40 MG TAB] 90 tablet 1    Sig: TAKE 1 TABLET BY MOUTH EVERY DAY     There is no refill protocol information for this order

## 2024-01-10 ENCOUNTER — Ambulatory Visit: Payer: BC Managed Care – PPO | Admitting: Obstetrics & Gynecology

## 2024-01-11 ENCOUNTER — Ambulatory Visit: Admitting: Physician Assistant

## 2024-01-11 ENCOUNTER — Encounter: Payer: Self-pay | Admitting: Physician Assistant

## 2024-01-11 ENCOUNTER — Other Ambulatory Visit (HOSPITAL_COMMUNITY)
Admission: RE | Admit: 2024-01-11 | Discharge: 2024-01-11 | Disposition: A | Discharge status: Home / Self Care | Source: Ambulatory Visit | Attending: Physician Assistant | Admitting: Physician Assistant

## 2024-01-11 VITALS — BP 117/79 | HR 77 | Temp 98.4°F | Resp 16 | Ht 64.0 in | Wt 173.0 lb

## 2024-01-11 DIAGNOSIS — Z124 Encounter for screening for malignant neoplasm of cervix: Secondary | ICD-10-CM | POA: Insufficient documentation

## 2024-01-11 DIAGNOSIS — R3915 Urgency of urination: Secondary | ICD-10-CM

## 2024-01-11 DIAGNOSIS — N898 Other specified noninflammatory disorders of vagina: Secondary | ICD-10-CM

## 2024-01-11 DIAGNOSIS — Z803 Family history of malignant neoplasm of breast: Secondary | ICD-10-CM

## 2024-01-11 DIAGNOSIS — Z1231 Encounter for screening mammogram for malignant neoplasm of breast: Secondary | ICD-10-CM

## 2024-01-11 LAB — POCT URINALYSIS DIP (MANUAL ENTRY)
Bilirubin, UA: NEGATIVE
Blood, UA: NEGATIVE
Glucose, UA: NEGATIVE mg/dL
Ketones, POC UA: NEGATIVE mg/dL
Leukocytes, UA: NEGATIVE
Nitrite, UA: NEGATIVE
Protein Ur, POC: NEGATIVE mg/dL
Spec Grav, UA: 1.015 (ref 1.010–1.025)
Urobilinogen, UA: 0.2 U/dL
pH, UA: 7 (ref 5.0–8.0)

## 2024-01-11 NOTE — Progress Notes (Signed)
 Established patient visit   Patient: Alexandria Jackson   DOB: 01/19/1986   38 y.o. Female  MRN: [id number] Visit Date: 01/11/2024  Today's healthcare provider: Alfredia Ferguson, PA-C   Cc. Cervical cancer screening  Subjective     Pt presents today for breast exam, pap smear.  She reports possibly having a vaginal infection, lower abdominal pressure and vaginal discharge. Reports this occurs frequently after intercourse.  Medications: Outpatient Medications Prior to Visit  Medication Sig   CHOLECALCIFEROL PO Take by mouth.   [DISCONTINUED] pantoprazole (PROTONIX) 40 MG tablet Take 1 tablet (40 mg total) by mouth daily.   No facility-administered medications prior to visit.    Review of Systems  Constitutional:  Negative for fatigue and fever.  Respiratory:  Negative for cough and shortness of breath.   Cardiovascular:  Negative for chest pain and leg swelling.  Gastrointestinal:  Negative for abdominal pain.  Genitourinary:  Positive for vaginal discharge.  Neurological:  Negative for dizziness and headaches.       Objective    BP 117/79 (BP Location: Left Arm, Patient Position: Sitting, Cuff Size: Normal)   Pulse 77   Temp 98.4 F (36.9 C) (Oral)   Resp 16   Ht 5\' 4"  (1.626 m)   Wt 173 lb (78.5 kg)   SpO2 98%   BMI 29.70 kg/m    Physical Exam Vitals reviewed.  Constitutional:      Appearance: She is not ill-appearing.  HENT:     Head: Normocephalic.  Eyes:     Conjunctiva/sclera: Conjunctivae normal.  Cardiovascular:     Rate and Rhythm: Normal rate.  Pulmonary:     Effort: Pulmonary effort is normal. No respiratory distress.  Chest:     Comments: B/l breasts with significant nodularity. No one discrete mass. No nipple inversion, discharge.  Genitourinary:    Labia:        Right: No rash or lesion.        Left: No rash or lesion.      Vagina: Normal.     Cervix: Friability present.  Neurological:     Mental Status: She is alert and  oriented to person, place, and time.  Psychiatric:        Mood and Affect: Mood normal.        Behavior: Behavior normal.     Results for orders placed or performed in visit on 01/11/24  POCT urinalysis dipstick  Result Value Ref Range   Color, UA yellow yellow   Clarity, UA clear clear   Glucose, UA negative negative mg/dL   Bilirubin, UA negative negative   Ketones, POC UA negative negative mg/dL   Spec Grav, UA 5.427 0.623 - 1.025   Blood, UA negative negative   pH, UA 7.0 5.0 - 8.0   Protein Ur, POC negative negative mg/dL   Urobilinogen, UA 0.2 0.2 or 1.0 E.U./dL   Nitrite, UA Negative Negative   Leukocytes, UA Negative Negative    Assessment & Plan    Cervical cancer screening -     Cytology - PAP  Vaginal discharge -     Cervicovaginal ancillary only  Urinary urgency -     POCT urinalysis dipstick  Breast cancer screening by mammogram -     3D Screening Mammogram, Left and Right; Future  Family history of breast cancer -     3D Screening Mammogram, Left and Right; Future   UA negative. No need for culture. Will r/o bv,  yeast, sti.   Given history of FA, dense breasts w/ nodularity, and family history of breast cancer in her mother, recommending early annual mammograms.  Return in about 1 year (around 01/10/2025), or if symptoms worsen or fail to improve, for CPE.       Alfredia Ferguson, PA-C  [facility] Primary Care at [facility] [phone] (phone) [phone] (fax)  [facility]

## 2024-01-13 ENCOUNTER — Encounter: Payer: Self-pay | Admitting: Physician Assistant

## 2024-01-13 LAB — CERVICOVAGINAL ANCILLARY ONLY
Bacterial Vaginitis (gardnerella): NEGATIVE
Candida Glabrata: NEGATIVE
Candida Vaginitis: NEGATIVE
Chlamydia: NEGATIVE
Comment: NEGATIVE
Comment: NEGATIVE
Comment: NEGATIVE
Comment: NEGATIVE
Comment: NEGATIVE
Comment: NORMAL
Neisseria Gonorrhea: NEGATIVE
Trichomonas: NEGATIVE

## 2024-01-13 LAB — CYTOLOGY - PAP
Adequacy: ABSENT
Comment: NEGATIVE
Diagnosis: NEGATIVE
High risk HPV: NEGATIVE

## 2024-03-27 ENCOUNTER — Ambulatory Visit
Admission: EM | Admit: 2024-03-27 | Discharge: 2024-03-27 | Disposition: A | Discharge status: Home / Self Care | Attending: Physician Assistant | Admitting: Physician Assistant

## 2024-03-27 DIAGNOSIS — H6993 Unspecified Eustachian tube disorder, bilateral: Secondary | ICD-10-CM

## 2024-03-27 MED ORDER — FLUTICASONE PROPIONATE 50 MCG/ACT NA SUSP: 1.0000 | Freq: Every day | NASAL | 0 refills | Status: AC

## 2024-03-27 NOTE — ED Provider Notes (Signed)
 [facility]    CSN: [id number] Arrival date & time: 03/27/24  1029      History   Chief Complaint Chief Complaint  Patient presents with   Otalgia    HPI Alexandria Jackson is a 38 y.o. female.   Patient presents today for evaluation of bilateral ear ringing and discomfort that started 2 weeks ago.  She has not had any other symptoms.  She denies any congestion or cough.  She does not report treatment for symptoms.  The history is provided by the patient.    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   Influenza vaccination declined 08/22/2023   Class 1 obesity with serious comorbidity and body mass index (BMI) of 30.0 to 30.9 in adult 08/22/2023   Early satiety 08/22/2023   Overflow incontinence of urine 08/22/2023   Iron deficiency anemia 09/05/2019   Menorrhagia 09/05/2019   Acquired hypothyroidism 08/22/2019   Acne vulgaris 10/08/2016   Situational anxiety 10/08/2016   Vitamin D  insufficiency 03/13/2015   Fatigue 12/26/2014   Headache, unspecified headache type 12/26/2014    Past Surgical History:  Procedure Laterality Date   TUBAL LIGATION      OB History   No obstetric history on file.      Home Medications    Prior to Admission medications   Medication Sig Start Date End Date Taking? Authorizing Provider  fluticasone  (FLONASE ) 50 MCG/ACT nasal spray Place 1 spray into both nostrils daily. 03/27/24  Yes Vernestine Gondola, PA-C  CHOLECALCIFEROL PO Take by mouth.    [provider]    Family History Family History  Problem Relation Age of Onset   Breast cancer Mother        69s   Cancer Maternal Aunt    Breast cancer Maternal Aunt        unknown age   Colon cancer Maternal Uncle     Social History Social History    Tobacco Use   Smoking status: Never   Smokeless tobacco: Never  Substance Use Topics   Alcohol use: Yes    Comment: occasion   Drug use: No     Allergies   Patient has no known allergies.   Review of Systems Review of Systems  Constitutional:  Negative for chills and fever.  HENT:  Positive for ear pain and tinnitus. Negative for congestion and sore throat.   Eyes:  Negative for discharge and redness.  Respiratory:  Negative for cough, shortness of breath and wheezing.   Gastrointestinal:  Negative for abdominal pain, diarrhea, nausea and vomiting.     Physical Exam Triage Vital Signs ED Triage Vitals  Encounter Vitals Group     BP      Systolic BP Percentile      Diastolic BP Percentile      Pulse      Resp      Temp      Temp src      SpO2      Weight      Height      Head Circumference      Peak Flow      Pain Score      Pain Loc      Pain Education  Exclude from Growth Chart    No data found.  Updated Vital Signs BP 107/73 (BP Location: Left Arm)   Pulse 61   Temp 97.9 F (36.6 C) (Oral)   Resp 16   LMP 03/16/2024 (Approximate)   SpO2 99%   Visual Acuity Right Eye Distance:   Left Eye Distance:   Bilateral Distance:    Right Eye Near:   Left Eye Near:    Bilateral Near:     Physical Exam Vitals and nursing note reviewed.  Constitutional:      General: She is not in acute distress.    Appearance: Normal appearance. She is not ill-appearing.  HENT:     Head: Normocephalic and atraumatic.     Ears:     Comments: Bilateral TMs dull but nonerythematous    Nose: Nose normal. No congestion or rhinorrhea.     Mouth/Throat:     Mouth: Mucous membranes are moist.     Pharynx: No oropharyngeal exudate or posterior oropharyngeal erythema.  Eyes:     Conjunctiva/sclera: Conjunctivae normal.  Cardiovascular:     Rate and Rhythm: Normal rate.  Pulmonary:     Effort: Pulmonary effort is normal. No respiratory distress.  Skin:     General: Skin is warm and dry.  Neurological:     Mental Status: She is alert.  Psychiatric:        Mood and Affect: Mood normal.        Thought Content: Thought content normal.      UC Treatments / Results  Labs (all labs ordered are listed, but only abnormal results are displayed) Labs Reviewed - No data to display  EKG   Radiology No results found.  Procedures Procedures (including critical care time)  Medications Ordered in UC Medications - No data to display  Initial Impression / Assessment and Plan / UC Course  I have reviewed the triage vital signs and the nursing notes.  Pertinent labs & imaging results that were available during my care of the patient were reviewed by me and considered in my medical decision making (see chart for details).    Suspect likely eustachian tube dysfunction and will treat with steroid nasal spray (Flonase ).  Encouraged follow-up if no gradual improvement or with any further concerns.  Final Clinical Impressions(s) / UC Diagnoses   Final diagnoses:  Eustachian tube dysfunction, bilateral   Discharge Instructions   None    ED Prescriptions     Medication Sig Dispense Auth. Provider   fluticasone  (FLONASE ) 50 MCG/ACT nasal spray Place 1 spray into both nostrils daily. 15.8 mL Vernestine Gondola, PA-C      PDMP not reviewed this encounter.   Vernestine Gondola, PA-C 03/27/24 1227

## 2024-03-27 NOTE — ED Triage Notes (Signed)
 Bilateral ear pain and ringing x 2 weeks. Denies any other symptoms.

## 2024-04-12 ENCOUNTER — Ambulatory Visit: Admitting: Physician Assistant

## 2024-04-12 ENCOUNTER — Encounter: Payer: Self-pay | Admitting: Physician Assistant

## 2024-04-12 VITALS — BP 118/81 | HR 88 | Ht 64.0 in | Wt 173.2 lb

## 2024-04-12 DIAGNOSIS — H6991 Unspecified Eustachian tube disorder, right ear: Secondary | ICD-10-CM

## 2024-04-12 MED ORDER — AZELASTINE HCL 0.1 % NA SOLN: 1.0000 | Freq: Two times a day (BID) | NASAL | 0 refills | Status: DC

## 2024-04-12 NOTE — Progress Notes (Signed)
      Established patient visit   Patient: Alexandria  Cloris Jackson   DOB: 1986/07/06   38 y.o. Female  MRN: [id number] Visit Date: 04/12/2024  Today's healthcare provider: Trenton Frock, PA-C   Cc. Bilateral ears,   Subjective     Pt was seen at urgent care 5/26 for ETD, given flonase . Today, reports continued symptoms, right ear pain, fullness. Denies other URI symptoms. Only using flonase .   Medications: Outpatient Medications Prior to Visit  Medication Sig   fluticasone  (FLONASE ) 50 MCG/ACT nasal spray Place 1 spray into both nostrils daily.   CHOLECALCIFEROL PO Take by mouth. (Patient not taking: Reported on 04/12/2024)   No facility-administered medications prior to visit.    Review of Systems  Constitutional:  Negative for fatigue and fever.  HENT:  Positive for ear pain.   Respiratory:  Negative for cough and shortness of breath.   Cardiovascular:  Negative for chest pain and leg swelling.  Gastrointestinal:  Negative for abdominal pain.  Neurological:  Negative for dizziness and headaches.       Objective    BP 118/81   Pulse 88   Ht 5' 4 (1.626 m)   Wt 173 lb 3.2 oz (78.6 kg)   LMP 03/16/2024 (Approximate)   SpO2 96%   BMI 29.73 kg/m    Physical Exam Vitals reviewed.  Constitutional:      Appearance: She is not ill-appearing.  HENT:     Head: Normocephalic.     Ears:     Comments: Right TM with erythema mild bulge with clear fluid. L TM mild bulging with clear fluid. No erythema on left.  Eyes:     Conjunctiva/sclera: Conjunctivae normal.  Cardiovascular:     Rate and Rhythm: Normal rate.  Pulmonary:     Effort: Pulmonary effort is normal. No respiratory distress.  Neurological:     Mental Status: She is alert and oriented to person, place, and time.  Psychiatric:        Mood and Affect: Mood normal.        Behavior: Behavior normal.     No results found for any visits on 04/12/24.  Assessment & Plan    Eustachian tube dysfunction,  right -     Azelastine HCl; Place 1 spray into both nostrils 2 (two) times daily. Use in each nostril as directed  Dispense: 30 mL; Refill: 0   Recommending cont flonase , adding azelastine, add claritin or zyrtec  or other antihist otc.  Recommending saline rinses.  If symptoms persist the next few days, advised pt to reach out and would rx augmentin .   Return if symptoms worsen or fail to improve.       Trenton Frock, PA-C  [facility] at [facility] [phone] (phone) [phone] (fax)  [facility]

## 2024-05-05 ENCOUNTER — Other Ambulatory Visit: Payer: Self-pay | Admitting: Physician Assistant

## 2024-05-05 DIAGNOSIS — H6991 Unspecified Eustachian tube disorder, right ear: Secondary | ICD-10-CM

## 2024-05-09 ENCOUNTER — Ambulatory Visit: Admitting: Physician Assistant

## 2024-05-15 ENCOUNTER — Ambulatory Visit: Admitting: Physician Assistant

## 2024-05-15 ENCOUNTER — Encounter: Payer: Self-pay | Admitting: Physician Assistant

## 2024-05-15 VITALS — BP 118/75 | HR 77 | Ht 64.0 in | Wt 171.2 lb

## 2024-05-15 DIAGNOSIS — H9313 Tinnitus, bilateral: Secondary | ICD-10-CM | POA: Diagnosis not present

## 2024-05-15 DIAGNOSIS — R42 Dizziness and giddiness: Secondary | ICD-10-CM | POA: Diagnosis not present

## 2024-05-15 DIAGNOSIS — R519 Headache, unspecified: Secondary | ICD-10-CM | POA: Diagnosis not present

## 2024-05-15 NOTE — Progress Notes (Signed)
 Established patient visit   Patient: Alexandria Jackson   DOB: 12/01/1985   38 y.o. Female  MRN: [id number] Visit Date: 05/15/2024  Today's healthcare provider: Manuelita Flatness, PA-C   Chief Complaint  Patient presents with   Dizziness    Off and on for a couple months   Subjective     Discussed the use of AI scribe software for clinical note transcription with the patient, who gave verbal consent to proceed.  History of Present Illness   The patient presents forevaluation of dizziness and headaches.  She experiences dizziness twice a week, described as a spinning sensation lasting less than a minute, without specific triggers. She sometimes requires support during these episodes. There is intermittent tinnitus, not coinciding with dizziness.  Headaches have increased in frequency over the past two weeks, occurring five to six times. They are frontal with photophobia. She uses ibuprofen  for relief but not consistently. She associates headaches with not wearing her glasses, which she finds uncomfortable, leading to difficulty understanding people and watching TV.  Denies recent injury.       Medications: Outpatient Medications Prior to Visit  Medication Sig   Azelastine  HCl 137 MCG/SPRAY SOLN Place 1 spray into both nostrils in the morning and at bedtime.   CHOLECALCIFEROL PO Take by mouth. (Patient not taking: Reported on 04/12/2024)   fluticasone  (FLONASE ) 50 MCG/ACT nasal spray Place 1 spray into both nostrils daily.   No facility-administered medications prior to visit.    Review of Systems  Constitutional:  Negative for fatigue and fever.  HENT:  Positive for tinnitus.   Respiratory:  Negative for cough and shortness of breath.   Cardiovascular:  Negative for chest pain and leg swelling.  Gastrointestinal:  Negative for abdominal pain.  Neurological:  Positive for dizziness and headaches.       Objective    BP 118/75   Pulse 77   Ht 5' 4 (1.626 m)    Wt 171 lb 3.2 oz (77.7 kg)   LMP 05/09/2024   BMI 29.39 kg/m    Physical Exam Constitutional:      General: She is awake.     Appearance: She is well-developed.  HENT:     Head: Normocephalic.  Eyes:     Extraocular Movements: Extraocular movements intact.     Conjunctiva/sclera: Conjunctivae normal.     Pupils: Pupils are equal, round, and reactive to light.  Cardiovascular:     Rate and Rhythm: Normal rate and regular rhythm.     Heart sounds: Normal heart sounds.  Pulmonary:     Effort: Pulmonary effort is normal.     Breath sounds: Normal breath sounds.  Skin:    General: Skin is warm.  Neurological:     Mental Status: She is alert and oriented to person, place, and time.  Psychiatric:        Attention and Perception: Attention normal.        Mood and Affect: Mood normal.        Speech: Speech normal.        Behavior: Behavior is cooperative.      No results found for any visits on 05/15/24.  Assessment & Plan    Dizziness -     Ambulatory referral to ENT  Tinnitus of both ears -     Ambulatory referral to ENT  Headache, unspecified headache type   Symptoms seem consistent w/ meniere's disease, referring to ENT . Reviewed with patient she may  want to see if there is a relationship between her fluid balance ( dehydration, water intake, salt intake, exercise) and the dizziness episodes  Given episodes of dizziness are < a few minutes, meclizine likely not helpful.  I dont want to discount headaches, may be 2/2 to needing to wear glasses and vision strain, but given dizziness and tinnitus, if the headaches do not resolve, would recommend head CT  Return if symptoms worsen or fail to improve.       Manuelita Flatness, PA-C  [facility] at [facility] [phone] (phone) [phone] (fax)  [facility]

## 2024-07-03 ENCOUNTER — Other Ambulatory Visit: Payer: Self-pay

## 2024-07-03 ENCOUNTER — Emergency Department (HOSPITAL_BASED_OUTPATIENT_CLINIC_OR_DEPARTMENT_OTHER)
Admission: EM | Admit: 2024-07-03 | Discharge: 2024-07-03 | Disposition: A | Discharge status: Home / Self Care | Attending: Emergency Medicine | Admitting: Emergency Medicine

## 2024-07-03 ENCOUNTER — Emergency Department (HOSPITAL_BASED_OUTPATIENT_CLINIC_OR_DEPARTMENT_OTHER)

## 2024-07-03 ENCOUNTER — Encounter (HOSPITAL_BASED_OUTPATIENT_CLINIC_OR_DEPARTMENT_OTHER): Payer: Self-pay

## 2024-07-03 DIAGNOSIS — R109 Unspecified abdominal pain: Secondary | ICD-10-CM | POA: Diagnosis not present

## 2024-07-03 DIAGNOSIS — R1032 Left lower quadrant pain: Secondary | ICD-10-CM | POA: Insufficient documentation

## 2024-07-03 DIAGNOSIS — N200 Calculus of kidney: Secondary | ICD-10-CM | POA: Diagnosis not present

## 2024-07-03 LAB — CBC WITH DIFFERENTIAL/PLATELET
Abs Immature Granulocytes: 0.02 K/uL (ref 0.00–0.07)
Basophils Absolute: 0 K/uL (ref 0.0–0.1)
Basophils Relative: 1 %
Eosinophils Absolute: 0.1 K/uL (ref 0.0–0.5)
Eosinophils Relative: 2 %
HCT: 37 % (ref 36.0–46.0)
Hemoglobin: 12.1 g/dL (ref 12.0–15.0)
Immature Granulocytes: 0 %
Lymphocytes Relative: 35 %
Lymphs Abs: 2.2 K/uL (ref 0.7–4.0)
MCH: 26.2 pg (ref 26.0–34.0)
MCHC: 32.7 g/dL (ref 30.0–36.0)
MCV: 80.1 fL (ref 80.0–100.0)
Monocytes Absolute: 0.5 K/uL (ref 0.1–1.0)
Monocytes Relative: 7 %
Neutro Abs: 3.5 K/uL (ref 1.7–7.7)
Neutrophils Relative %: 55 %
Platelets: 336 K/uL (ref 150–400)
RBC: 4.62 MIL/uL (ref 3.87–5.11)
RDW: 16.8 % — ABNORMAL HIGH (ref 11.5–15.5)
WBC: 6.4 K/uL (ref 4.0–10.5)
nRBC: 0 % (ref 0.0–0.2)

## 2024-07-03 LAB — URINALYSIS, ROUTINE W REFLEX MICROSCOPIC
Bilirubin Urine: NEGATIVE
Glucose, UA: NEGATIVE mg/dL
Ketones, ur: NEGATIVE mg/dL
Leukocytes,Ua: NEGATIVE
Nitrite: NEGATIVE
Protein, ur: 100 mg/dL — AB
Specific Gravity, Urine: 1.025 (ref 1.005–1.030)
pH: 5.5 (ref 5.0–8.0)

## 2024-07-03 LAB — COMPREHENSIVE METABOLIC PANEL WITH GFR
ALT: 6 U/L (ref 0–44)
AST: 17 U/L (ref 15–41)
Albumin: 4 g/dL (ref 3.5–5.0)
Alkaline Phosphatase: 100 U/L (ref 38–126)
Anion gap: 9 (ref 5–15)
BUN: 14 mg/dL (ref 6–20)
CO2: 25 mmol/L (ref 22–32)
Calcium: 8.8 mg/dL — ABNORMAL LOW (ref 8.9–10.3)
Chloride: 104 mmol/L (ref 98–111)
Creatinine, Ser: 0.65 mg/dL (ref 0.44–1.00)
GFR, Estimated: >60 mL/min (ref >60)
Glucose, Bld: 89 mg/dL (ref 70–99)
Potassium: 4.3 mmol/L (ref 3.5–5.1)
Sodium: 138 mmol/L (ref 135–145)
Total Bilirubin: 0.4 mg/dL (ref 0.0–1.2)
Total Protein: 7.3 g/dL (ref 6.5–8.1)

## 2024-07-03 LAB — URINALYSIS, MICROSCOPIC (REFLEX): RBC / HPF: >50 RBC/hpf (ref 0–5)

## 2024-07-03 LAB — PREGNANCY, URINE: Preg Test, Ur: NEGATIVE

## 2024-07-03 LAB — LIPASE, BLOOD: Lipase: 25 U/L (ref 11–51)

## 2024-07-03 MED ORDER — SODIUM CHLORIDE 0.9 % IV BOLUS
1000.0000 mL | Freq: Once | INTRAVENOUS | Status: AC
  Administered 2024-07-03: 1000 mL via INTRAVENOUS

## 2024-07-03 MED ORDER — HYDROCODONE-ACETAMINOPHEN 5-325 MG PO TABS: 1.0000 | ORAL_TABLET | ORAL | 0 refills | Status: AC | PRN

## 2024-07-03 MED ORDER — ONDANSETRON HCL 4 MG/2ML IJ SOLN: 4.0000 mg | Freq: Once | INTRAMUSCULAR | Status: DC

## 2024-07-03 NOTE — Discharge Instructions (Signed)
  There are many causes of abdominal pain. Most pain is not serious and goes away, but some pain gets worse, changes, or will not go away. Please return to the emergency department or see your doctor right away if you (or your family member) experience any of the following:  1. Pain that gets worse or moves to just one spot.  2. Pain that gets worse if you cough or sneeze.  3. Pain with going over a bump in the road.  4. Pain that does not get better in 24 hours.  5. Inability to keep down liquids (vomiting)-especially if you are making less urine.  6. Fainting.  7. Blood in the vomit or stool.  8. High fever or shaking chills.  9. Swelling of the abdomen.  10. Any new or worsening problem.     Additional Instructions  No alcohol.  No caffeine, aspirin, or cigarettes.   Please return to the emergency department immediately for any new or concerning symptoms, or if you get worse.

## 2024-07-03 NOTE — ED Notes (Signed)
 Patient transported to CT

## 2024-07-03 NOTE — ED Triage Notes (Signed)
 Pt ambulatory to er room number 8, pt states that she is here for L sided flank pain.  Pt states that her pain started this am, states that nothing seems to make the pain better or worse. Repots a similar episodes and was dx with kidney stones

## 2024-07-03 NOTE — ED Notes (Signed)
 Pt advised she's been hurting since this morning. The pt had no recent trauma or injury, does have a hx of kidney stones.

## 2024-07-03 NOTE — ED Provider Notes (Signed)
 [facility] [facility] Provider Note  CSN: [id number] Arrival date & time: 07/03/24 0946  Chief Complaint(s) Flank Pain  HPI Alexandria  Zafira Jackson is a 38 y.o. female with past medical history as below, significant for IDA, menorrhagia, obesity, anxiety who presents to the ED with complaint of flank pain  Pain earlier this morning, left-sided, sharp and stabbing sensation, unable to find position of comfort, feels similar to prior kidney stone.  Had some nausea without vomiting.  No dysuria urgency, she is currently menstruating is unsure if she is having hematuria.  No diarrhea.  No fevers.  No recent abdominal trauma.  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 Authorizing Provider  Azelastine  HCl 137 MCG/SPRAY SOLN Place 1 spray into both nostrils in the morning and at bedtime. 05/08/24   Cyndi Shaver, PA-C  CHOLECALCIFEROL PO Take by mouth. Patient not taking: Reported on 04/12/2024    [provider]  fluticasone  (FLONASE ) 50 MCG/ACT nasal spray Place 1 spray into both nostrils daily. 03/27/24   Billy Asberry FALCON, PA-C                                                                                                                                    Past Surgical History Past Surgical History:  Procedure Laterality Date   TUBAL LIGATION     Family History Family History  Problem Relation Age of Onset   Breast cancer Mother        23s   Cancer Maternal Aunt    Breast cancer Maternal Aunt        unknown age   Colon cancer Maternal Uncle     Social History Social History   Tobacco Use   Smoking status: Never   Smokeless tobacco: Never  Vaping Use   Vaping status: Never Used  Substance Use Topics   Alcohol use: Yes    Comment: occasion   Drug use: No   Allergies Patient has no known allergies.  Review of Systems A thorough review of systems was obtained and  all systems are negative except as noted in the HPI and PMH.   Physical Exam Vital Signs  I have reviewed the triage vital signs BP (!) 119/92 (BP Location: Left Arm)   Pulse 66   Temp 98.6 F (37 C)   Resp 18   Ht 5' 3 (1.6 m)   Wt 78.9 kg   LMP 07/01/2024   SpO2 100%   BMI 30.82 kg/m  Physical Exam Vitals and nursing note reviewed.  Constitutional:      General: She is not in acute distress.    Appearance: Normal appearance. She is well-developed. She is not ill-appearing.  HENT:     Head: Normocephalic and atraumatic.     Right Ear: External ear normal.     Left Ear: External ear normal.     Nose: Nose normal.     Mouth/Throat:     Mouth: Mucous membranes are moist.  Eyes:     General: No scleral icterus.       Right eye: No discharge.        Left eye: No discharge.  Cardiovascular:     Rate and Rhythm: Normal rate.  Pulmonary:     Effort: Pulmonary effort is normal. No respiratory distress.     Breath sounds: No stridor.  Abdominal:     General: Abdomen is flat. There is no distension.     Palpations: Abdomen is soft.     Tenderness: There is no guarding.  Musculoskeletal:        General: No  deformity.     Cervical back: No rigidity.  Skin:    General: Skin is warm and dry.     Coloration: Skin is not cyanotic, jaundiced or pale.  Neurological:     Mental Status: She is alert and oriented to person, place, and time.     GCS: GCS eye subscore is 4. GCS verbal subscore is 5. GCS motor subscore is 6.  Psychiatric:        Speech: Speech normal.        Behavior: Behavior normal. Behavior is cooperative.     ED Results and Treatments Labs (all labs ordered are listed, but only abnormal results are displayed) Labs Reviewed  CBC WITH DIFFERENTIAL/PLATELET - Abnormal; Notable for the following components:      Result Value   RDW 16.8 (*)    All other components within normal limits  COMPREHENSIVE METABOLIC PANEL WITH GFR - Abnormal; Notable for the following components:   Calcium 8.8 (*)    All other components within normal limits  URINALYSIS, ROUTINE W REFLEX MICROSCOPIC - Abnormal; Notable for the following components:   Color, Urine BROWN (*)    APPearance CLOUDY (*)    Hgb urine dipstick LARGE (*)    Protein, ur 100 (*)    All other components within normal limits  URINALYSIS, MICROSCOPIC (REFLEX) - Abnormal; Notable for the following components:   Bacteria, UA FEW (*)    All other components within normal limits  LIPASE, BLOOD  PREGNANCY, URINE  Radiology CT Renal Stone Study Result Date: 07/03/2024 CLINICAL DATA:  Left flank pain that started this morning, kidney stone suspected EXAM: CT ABDOMEN AND PELVIS WITHOUT CONTRAST TECHNIQUE: Multidetector CT imaging of the abdomen and pelvis was performed following the standard protocol without IV contrast. RADIATION DOSE REDUCTION: This exam was performed according to the departmental dose-optimization program which includes automated exposure control, adjustment of the mA and/or kV according to patient  size and/or use of iterative reconstruction technique. COMPARISON:  September 15, 2019 FINDINGS: Lower chest: No acute abnormality. Hepatobiliary: No suspicious liver lesion. No gallstones, gallbladder wall thickening, or biliary dilatation. Pancreas: Unremarkable. Spleen: Unremarkable. Adrenals/Urinary Tract: Adrenal glands are unremarkable. Punctate nonobstructing right-sided renal stones. No obstructing ureteral stones identified with likely tiny pelvic phleboliths. No hydronephrosis. Stomach/Bowel: No evidence of bowel obstruction or inflammation. Appendix is unremarkable. Vascular/Lymphatic: Normal caliber aorta. No lymphadenopathy by size criteria. Reproductive: Unremarkable. Other: No free air or free fluid. Musculoskeletal: No acute osseous findings. IMPRESSION: No hydronephrosis or obstructing ureteral stones identified. Tiny nonobstructing right renal stones. Electronically Signed   By: Michaeline Blanch M.D.   On: 07/03/2024 11:51    Pertinent labs & imaging results that were available during my care of the patient were reviewed by me and considered in my medical decision making (see MDM for details).  Medications Ordered in ED Medications  ondansetron  (ZOFRAN ) injection 4 mg (4 mg Intravenous Patient Refused/Not Given 07/03/24 1050)  sodium chloride  0.9 % bolus 1,000 mL (0 mLs Intravenous Stopped 07/03/24 1254)                                                                                                                                     Procedures Procedures  (including critical care time)  Medical Decision Making / ED Course    Medical Decision Making:    Alexandria  Beronica Jackson is a 38 y.o. female with past medical history as below, significant for IDA, menorrhagia, obesity, anxiety who presents to the ED with complaint of flank pain. The complaint involves an extensive differential diagnosis and also carries with it a high risk of complications and morbidity.  Serious etiology was  considered. Ddx includes but is not limited to: Differential diagnosis includes but is not exclusive to ectopic pregnancy, ovarian cyst, ovarian torsion, acute appendicitis, urinary tract infection, endometriosis, bowel obstruction, hernia, colitis, renal colic, gastroenteritis, volvulus etc.   Complete initial physical exam performed, notably the patient was in no acute distress, abdomen soft.    Reviewed and confirmed nursing documentation for past medical history, family history, social history.  Vital signs reviewed.    Left lower quad abdominal pain/flank pain left> - Acute onset of pain this morning, sharp and stabbing, feels similar to prior kidney stone - Pain improved prior to arrival to the ER - Labs and imaging here are reassuring, CT renal is stable, there are some tiny nonobstructing renal stones noted on the right but  no abnormalities on the left. - There is blood noted in her urine but she is currently on her menstrual cycle.  UA is not consistent with infection. - Patient symptoms have resolved, she remains pain-free and she is tolerant p.o. without difficulty.  Unclear etiology of her pain today but perhaps recently passed kidney stone given pain was consistent with prior kidney stones and suddenly resolved - Provide abdominal pain precautions, encouraged follow-up outpatient with PCP, provide analgesia for home in case symptoms recur.  2:18 PM:  I have discussed the diagnosis/risks/treatment options with the patient.  Evaluation and diagnostic testing in the emergency department does not suggest an emergent condition requiring admission or immediate intervention beyond what has been performed at this time.  They will follow up with PCP. We also discussed returning to the ED immediately if new or worsening sx occur. We discussed the sx which are most concerning (e.g., sudden worsening pain, fever, inability to tolerate by mouth, severe nausea, blood in stool) that necessitate  immediate return.    The patient appears reasonably screen and/or stabilized for discharge and I doubt any other medical condition or other [facility] requiring further screening, evaluation, or treatment in the ED at this time prior to discharge.                       Additional history obtained: -Additional history obtained from family -External records from outside source obtained and reviewed including: Chart review including previous notes, labs, imaging, consultation notes including  Urgent care documentation, primary care documentation, prior ER evaluation   Lab Tests: -I ordered, reviewed, and interpreted labs.   The pertinent results include:   Labs Reviewed  CBC WITH DIFFERENTIAL/PLATELET - Abnormal; Notable for the following components:      Result Value   RDW 16.8 (*)    All other components within normal limits  COMPREHENSIVE METABOLIC PANEL WITH GFR - Abnormal; Notable for the following components:   Calcium 8.8 (*)    All other components within normal limits  URINALYSIS, ROUTINE W REFLEX MICROSCOPIC - Abnormal; Notable for the following components:   Color, Urine BROWN (*)    APPearance CLOUDY (*)    Hgb urine dipstick LARGE (*)    Protein, ur 100 (*)    All other components within normal limits  URINALYSIS, MICROSCOPIC (REFLEX) - Abnormal; Notable for the following components:   Bacteria, UA FEW (*)    All other components within normal limits  LIPASE, BLOOD  PREGNANCY, URINE    Notable for labs are stable  EKG   EKG Interpretation Date/Time:    Ventricular Rate:    PR Interval:    QRS Duration:    QT Interval:    QTC Calculation:   R Axis:      Text Interpretation:           Imaging Studies ordered: I ordered imaging studies including CT renal I independently visualized the following imaging with scope of interpretation limited to determining acute life threatening conditions related to emergency care; findings noted above I agree  with the radiologist interpretation If any imaging was obtained with contrast I closely monitored patient for any possible adverse reaction a/w contrast administration in the emergency department   Medicines ordered and prescription drug management: Meds ordered this encounter  Medications   sodium chloride  0.9 % bolus 1,000 mL   ondansetron  (ZOFRAN ) injection 4 mg    -I have reviewed the patients home medicines and have made adjustments  as needed   Consultations Obtained: Not applicable  Cardiac Monitoring: Continuous pulse oximetry interpreted by myself, 99% on RA.    Social Determinants of Health:  Diagnosis or treatment significantly limited by social determinants of health: obesity   Reevaluation: After the interventions noted above, I reevaluated the patient and found that they have improved  Co morbidities that complicate the patient evaluation  Past Medical History:  Diagnosis Date   Allergy    Anxiety    Iron deficiency anemia 09/05/2019   Menorrhagia 09/05/2019   Thyroid  disease       Dispostion: Disposition decision including need for hospitalization was considered, and patient discharged from emergency department.    Final Clinical Impression(s) / ED Diagnoses Final diagnoses:  Flank pain        Elnor Jayson LABOR, DO 07/03/24 1418

## 2024-08-31 ENCOUNTER — Ambulatory Visit (INDEPENDENT_AMBULATORY_CARE_PROVIDER_SITE_OTHER): Payer: Self-pay | Admitting: Otolaryngology

## 2024-08-31 ENCOUNTER — Ambulatory Visit (INDEPENDENT_AMBULATORY_CARE_PROVIDER_SITE_OTHER): Admitting: Audiology

## 2024-08-31 ENCOUNTER — Encounter (INDEPENDENT_AMBULATORY_CARE_PROVIDER_SITE_OTHER): Payer: Self-pay | Admitting: Otolaryngology

## 2024-08-31 VITALS — BP 116/75 | HR 90 | Ht 63.0 in | Wt 180.0 lb

## 2024-08-31 DIAGNOSIS — H93293 Other abnormal auditory perceptions, bilateral: Secondary | ICD-10-CM | POA: Diagnosis not present

## 2024-08-31 DIAGNOSIS — R42 Dizziness and giddiness: Secondary | ICD-10-CM | POA: Diagnosis not present

## 2024-08-31 DIAGNOSIS — H9313 Tinnitus, bilateral: Secondary | ICD-10-CM

## 2024-08-31 DIAGNOSIS — Z0110 Encounter for examination of ears and hearing without abnormal findings: Secondary | ICD-10-CM

## 2024-08-31 NOTE — Progress Notes (Unsigned)
  [address] [place], [place] [address] [phone]  Audiological Evaluation    Name: Alexandria  Minola Jackson     DOB:   1986/01/03      MRN:   [id number]                                                                                     Service Date: 08/31/2024     Accompanied by: unaccompanied   Patient comes today after Dr. Tobie, ENT sent a referral for a hearing evaluation due to concerns with tinnitus.   Symptoms Yes Details  Hearing loss  []  Both ears, maybe  Tinnitus  [x]  Both ears - comes and goes  Ear pain/ infections/pressure  [x]  Pressure in both ears  Balance problems  [x]  Room spinning , lasts seconds, no known trigger  Noise exposure history  []    Previous ear surgeries  []    Family history of hearing loss  []    Amplification  []    Other  []      Otoscopy: Right ear: Clear external ear canal and notable landmarks visualized on the tympanic membrane. Left ear:  Clear external ear canal and notable landmarks visualized on the tympanic membrane.  Tympanometry: Right ear: Normal external ear canal volume with normal middle ear pressure and tympanic membrane compliance (Type A). Findings are suggestive of normal middle ear function. Left ear: Normal external ear canal volume with normal middle ear pressure and tympanic membrane compliance (Type A). Findings are suggestive of normal middle ear function.   Hearing Evaluation The hearing test results were completed under headphones and results are deemed to be of good reliability. Test technique:  conventional    Pure tone Audiometry: Both ears - Normal hearing from 125 Hz - 8000 Hz.  Speech Audiometry: Right ear- Speech Reception Threshold (SRT) was obtained at 15 dBHL. Left ear-Speech Reception Threshold (SRT) was obtained at 10 dBHL.   Word Recognition Score Tested using NU-6 (recorded) Right ear: 100% was obtained at a presentation level of 55 dBHL with contralateral masking which is deemed as   excellent. Left ear: 100% was obtained at a presentation level of 55 dBHL with contralateral masking which is deemed as  excellent.  Quick SIN; Completed under headphones with a bilateral presentation level of 70dBHL. Results-  3.5 signal to noise ratio (SNR)  loss  This is considered a mild SNR loss, may perform almost as well as normals in noise.  Impression: There is not a significant difference in pure-tone thresholds between ears. There is not a significant difference in the word recognition score in between ears.    Recommendations: Follow up with ENT as scheduled for today. Return for a hearing evaluation if concerns with hearing changes arise or per MD recommendation. Consider various tinnitus strategies, including the use of a sound generator, and/or tinnitus retraining therapy.    Alexandria Jackson, AUD

## 2024-08-31 NOTE — Progress Notes (Signed)
 Dear Dr. Cyndi, Here is my assessment for our mutual patient, Alexandria  Jackson. Thank you for allowing me the opportunity to care for your patient. Please do not hesitate to contact me should you have any other questions. Sincerely, Dr. Eldora Blanch  Otolaryngology Clinic Note Referring provider: Dr. Cyndi HPI:  Initial visit (08/2024): Discussed the use of AI scribe software for clinical note transcription with the patient, who gave verbal consent to proceed. History of Present Illness Alexandria  Dinita Jackson is a 38 year old female who presents with episodes of vertigo and tinnitus.   She has had recurrent vertigo with three episodes to date. The first two episodes occurred at work, with a sensation of the room spinning lasting about 30 seconds. No antecedent event. The most recent episode, about a month ago, was less severe and lasted even shorter. There are no specific triggers such as head movements. Not associated with rising from seated position, turning head, unclear triggers. No sound or pressure induced symptoms.  She has chronic bilateral tinnitus, described as a high-pitched 'wheeze' or 'screeching' sound, occurring randomly and resolving spontaneously. There is no ear pain, fullness, or drainage. She reports occasional itching in the right ear and infrequent popping sounds. She has no history of ear infections, ear surgery, or use of ear medications.  She denies hearing loss but sometimes has difficulty understanding speech. She does not use tobacco or any medications.  Patient additionally denies: deep pain in ear canal, eustachian tube symptoms such as popping, crackling, sensitive to pressure changes Patient also denies barotrauma, vestibular suppressant use, ototoxic medication use Prior ear surgery: no   ENT Surgery: no Personal or FHx of bleeding dz or anesthesia difficulty: no  GLP-1: no AP/AC: no  Tobacco: no  PMHx: GAD  Independent Review of Additional Tests or  Records:  Alexandria Jackson (05/15/2024): dizziness twice a week - spinning lasting < 1 minute, intermittent tinnitu;s noted headaches as well; Dx: Dizziness, ref to ENT Alexandria Jackson (04/12/2024): right ear pain and fullness, using flonase ; Dx: ETD; Rx: flonase , astelin , PO anthistamine, saline rinses CBC and CMP 07/03/2024: BUN/Cr 14/0.65; WBC 6.4, Eos 100, Plt 336 08/2024 Audiogram was independently reviewed and interpreted by me and it reveals - A/A tymps; WRT 100% at 55dB; hearing thresholds wnl, essentially symmetric, small ABG AD at 250Hz    SNHL= Sensorineural hearing loss  PMH/Meds/All/SocHx/FamHx/ROS:   Past Medical History:  Diagnosis Date   Allergy    Anxiety    Iron deficiency anemia 09/05/2019   Menorrhagia 09/05/2019   Thyroid  disease      Past Surgical History:  Procedure Laterality Date   TUBAL LIGATION      Family History  Problem Relation Age of Onset   Breast cancer Mother        46s   Cancer Maternal Aunt    Breast cancer Maternal Aunt        unknown age   Colon cancer Maternal Uncle      Social Connections: Socially Isolated (11/24/2021)   Social Connection and Isolation Panel    Frequency of Communication with Friends and Family: Once a week    Frequency of Social Gatherings with Friends and Family: Never    Attends Religious Services: Never    Database Administrator or Organizations: No    Attends Engineer, Structural: Not on file    Marital Status: Married      Current Outpatient Medications:    Azelastine  HCl 137 MCG/SPRAY SOLN, Place 1 spray into both  nostrils in the morning and at bedtime., Disp: 90 mL, Rfl: 0   fluticasone  (FLONASE ) 50 MCG/ACT nasal spray, Place 1 spray into both nostrils daily., Disp: 15.8 mL, Rfl: 0   HYDROcodone -acetaminophen  (NORCO/VICODIN) 5-325 MG tablet, Take 1 tablet by mouth every 4 (four) hours as needed for moderate pain (pain score 4-6) or severe pain (pain score 7-10)., Disp: 5 tablet, Rfl: 0   CHOLECALCIFEROL  PO, Take by mouth. (Patient not taking: Reported on 08/31/2024), Disp: , Rfl:    Physical Exam:   BP 116/75 (BP Location: Right Arm, Patient Position: Sitting, Cuff Size: Large)   Pulse 90   Ht 5' 3 (1.6 m)   Wt 180 lb (81.6 kg)   SpO2 94%   BMI 31.89 kg/m   Salient findings:  CN II-XII intact  Bilateral EAC clear and TM intact with well pneumatized middle ear spaces Weber 512: mid Rinne 512: AC > BC b/l  Dix Hallpike negative b/l today; no gross nystagmus Anterior rhinoscopy: Septum intact; bilateral inferior turbinates without significant hypertrophy No lesions of oral cavity/oropharynx No obviously palpable neck masses/lymphadenopathy/thyromegaly No respiratory distress or stridor   Seprately Identifiable Procedures:  Prior to initiating any procedures, risks/benefits/alternatives were explained to the patient and verbal consent obtained. None  Impression & Plans:  Alexandria  Jackson is a 38 y.o. female with:  1. Vertigo   2. Bilateral tinnitus    Based on hx and symptoms, suspect vertigo is related to benign paroxysmal positional vertigo (BPPV) - Discussed options including vest testing and rehab but given resolution, she opted to Monitor symptoms, and report if episodes become regular or severe. - Consider physical therapy referral for balance exercises if symptoms persist.  Intermittent bilateral tinnitus Intermittent high-pitched tinnitus without hearing loss.  - Monitor tinnitus, report if it becomes constant or bothersome.   - f/u PRN  See below regarding exact medications prescribed this encounter including dosages and route: No orders of the defined types were placed in this encounter.     Thank you for allowing me the opportunity to care for your patient. Please do not hesitate to contact me should you have any other questions.  Sincerely, Eldora Blanch, MD Otolaryngologist (ENT), [facility] Phone: [phone] Fax:  [phone]  09/10/2024, 6:31 PM   MDM:  Level 3 Complexity/Problems addressed: low Data complexity: mod - independent review of note, lab, test - Morbidity: low  - Prescription Drug prescribed or managed: n

## 2024-09-01 ENCOUNTER — Ambulatory Visit

## 2024-09-01 ENCOUNTER — Ambulatory Visit
Admission: RE | Admit: 2024-09-01 | Discharge: 2024-09-01 | Disposition: A | Discharge status: Home / Self Care | Source: Ambulatory Visit | Attending: Physician Assistant | Admitting: Physician Assistant

## 2024-09-01 DIAGNOSIS — Z1231 Encounter for screening mammogram for malignant neoplasm of breast: Secondary | ICD-10-CM

## 2024-09-01 DIAGNOSIS — Z803 Family history of malignant neoplasm of breast: Secondary | ICD-10-CM

## 2024-09-21 ENCOUNTER — Ambulatory Visit (HOSPITAL_COMMUNITY)
Admission: EM | Admit: 2024-09-21 | Discharge: 2024-09-21 | Disposition: A | Discharge status: Home / Self Care | Attending: Internal Medicine | Admitting: Internal Medicine

## 2024-09-21 ENCOUNTER — Encounter (HOSPITAL_COMMUNITY): Payer: Self-pay

## 2024-09-21 DIAGNOSIS — J069 Acute upper respiratory infection, unspecified: Secondary | ICD-10-CM

## 2024-09-21 DIAGNOSIS — J029 Acute pharyngitis, unspecified: Secondary | ICD-10-CM

## 2024-09-21 LAB — POCT RAPID STREP A (OFFICE): Rapid Strep A Screen: NEGATIVE

## 2024-09-21 MED ORDER — BENZONATATE 100 MG PO CAPS: 100.0000 mg | ORAL_CAPSULE | Freq: Three times a day (TID) | ORAL | 0 refills | Status: AC

## 2024-09-21 MED ORDER — PREDNISONE 20 MG PO TABS: 40.0000 mg | ORAL_TABLET | Freq: Every day | ORAL | 0 refills | Status: AC

## 2024-09-21 NOTE — Discharge Instructions (Addendum)
 Strep testing done today. This is negative. Symptoms and physical exam findings are consistent with viral upper respiratory infection with cough.  This does not require antibiotic treatment.  We focus treatment on improving the symptoms.  We will treat with the following:  Prednisone 40 mg (2 tablets) once daily for 5 days. Take this in the morning.  This is a steroid to help with inflammation and pain. Do not take ibuprofen  while you are taking this medication Benzonatate  (tessalon ) 100 mg every 8 hours as needed for cough.  May continue tylenol  for pain May continue hot teas and gargles.  Make sure to stay hydrated by drinking plenty of water. Return to urgent care or PCP if symptoms worsen or fail to resolve.

## 2024-09-21 NOTE — ED Triage Notes (Signed)
 Patient reports that she began having a sore throat 3 days ago and right ear pain yesterday. Patient denies any fever.  Patient states she has been doing salt water gargles, hot tea and broth, and taking Tylenol  for pain.

## 2024-09-21 NOTE — ED Provider Notes (Signed)
 [facility]    CSN: [id number] Arrival date & time: 09/21/24  1116      History   Chief Complaint Chief Complaint  Patient presents with   Sore Throat   Otalgia    HPI [place]  Lyndee Jackson is a 38 y.o. female.   38 y.o. female who presents to urgent care with complaints of sore throat, mild headache, cough, right ear pain and congestion. Her symptoms started with a very sore throat on Monday. It worsened to headache, cough and congestion with right ear pain. She denies any fevers, nausea, vomiting, shortness of breath. The throat is very painful with swallowing and the throat is very tight feeling. 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 She has been doing tylenol , hot tea, salt water gargles without much relief.   Sore Throat Pertinent negatives include no chest pain, no abdominal pain and no shortness of breath.  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   Influenza vaccination declined 08/22/2023   Class 1 obesity with serious comorbidity and body mass index (BMI) of 30.0 to 30.9 in adult 08/22/2023   Early satiety 08/22/2023   Overflow incontinence of urine 08/22/2023   Iron deficiency anemia 09/05/2019   Menorrhagia 09/05/2019   Acquired hypothyroidism 08/22/2019   Acne vulgaris 10/08/2016   Situational anxiety 10/08/2016   Vitamin D  insufficiency 03/13/2015   Fatigue 12/26/2014   Headache, unspecified headache type 12/26/2014    Past Surgical History:  Procedure Laterality Date   TUBAL LIGATION      OB History   No obstetric history on file.      Home Medications    Prior to Admission  medications   Medication Sig Start Date End Date Taking? Authorizing Provider  benzonatate  (TESSALON ) 100 MG capsule Take 1 capsule (100 mg total) by mouth every 8 (eight) hours. 09/21/24  Yes Quaid Yeakle A, PA-C  predniSONE (DELTASONE) 20 MG tablet Take 2 tablets (40 mg total) by mouth daily with breakfast for 5 days. 09/21/24 09/26/24 Yes Khamron Gellert A, PA-C  Azelastine  HCl 137 MCG/SPRAY SOLN Place 1 spray into both nostrils in the morning and at bedtime. Patient not taking: Reported on 09/21/2024 05/08/24   Cyndi Shaver, PA-C  CHOLECALCIFEROL PO Take by mouth. Patient not taking: Reported on 08/31/2024    [provider]  fluticasone  (FLONASE ) 50 MCG/ACT nasal spray Place 1 spray into both nostrils daily. Patient not taking: Reported on 09/21/2024 03/27/24   Billy Asberry FALCON, PA-C  HYDROcodone -acetaminophen  (NORCO/VICODIN) 5-325 MG tablet Take 1 tablet by mouth every 4 (four) hours as needed for moderate pain (pain score 4-6) or severe pain (pain score 7-10). Patient not taking: Reported on 09/21/2024 07/03/24   Elnor Jayson LABOR, DO    Family History Family History  Problem Relation Age of Onset   Breast cancer Mother        72s   Cancer Maternal Aunt    Breast cancer Maternal Aunt        unknown age   Colon cancer Maternal Uncle     Social History Social History   Tobacco  Use   Smoking status: Never   Smokeless tobacco: Never  Vaping Use   Vaping status: Never Used  Substance Use Topics   Alcohol use: Yes    Comment: occasion   Drug use: No     Allergies   Patient has no known allergies.   Review of Systems Review of Systems  Constitutional:  Negative for chills and fever.  HENT:  Positive for congestion, ear pain, sore throat and trouble swallowing.   Eyes:  Negative for pain and visual disturbance.  Respiratory:  Positive for cough. Negative for shortness of breath.   Cardiovascular:  Negative for chest pain and palpitations.  Gastrointestinal:   Negative for abdominal pain and vomiting.  Genitourinary:  Negative for dysuria and hematuria.  Musculoskeletal:  Negative for arthralgias and back pain.  Skin:  Negative for color change and rash.  Neurological:  Negative for seizures and syncope.  All other systems reviewed and are negative.    Physical Exam Triage Vital Signs ED Triage Vitals  Encounter Vitals Group     BP 09/21/24 1210 110/74     Girls Systolic BP Percentile --      Girls Diastolic BP Percentile --      Boys Systolic BP Percentile --      Boys Diastolic BP Percentile --      Pulse Rate 09/21/24 1210 63     Resp 09/21/24 1210 14     Temp 09/21/24 1210 98.2 F (36.8 C)     Temp Source 09/21/24 1210 Oral     SpO2 09/21/24 1210 96 %     Weight --      Height --      Head Circumference --      Peak Flow --      Pain Score 09/21/24 1209 5     Pain Loc --      Pain Education --      Exclude from Growth Chart --    No data found.  Updated Vital Signs BP 110/74 (BP Location: Right Arm)   Pulse 63   Temp 98.2 F (36.8 C) (Oral)   Resp 14   LMP 09/12/2024 (Approximate)   SpO2 96%   Visual Acuity Right Eye Distance:   Left Eye Distance:   Bilateral Distance:    Right Eye Near:   Left Eye Near:    Bilateral Near:     Physical Exam Vitals and nursing note reviewed.  Constitutional:      General: She is not in acute distress.    Appearance: She is well-developed.  HENT:     Head: Normocephalic and atraumatic.     Right Ear: Tympanic membrane normal.     Left Ear: Tympanic membrane normal.     Mouth/Throat:     Mouth: Mucous membranes are moist.     Pharynx: Pharyngeal swelling and posterior oropharyngeal erythema present.  Eyes:     Conjunctiva/sclera: Conjunctivae normal.  Cardiovascular:     Rate and Rhythm: Normal rate and regular rhythm.     Heart sounds: No murmur heard. Pulmonary:     Effort: Pulmonary effort is normal. No respiratory distress.     Breath sounds: Normal breath  sounds.  Abdominal:     Palpations: Abdomen is soft.     Tenderness: There is no abdominal tenderness.  Musculoskeletal:        General: No swelling.     Cervical back: Neck supple.  Skin:    General: Skin is warm and  dry.     Capillary Refill: Capillary refill takes less than 2 seconds.  Neurological:     Mental Status: She is alert.  Psychiatric:        Mood and Affect: Mood normal.      UC Treatments / Results  Labs (all labs ordered are listed, but only abnormal results are displayed) Labs Reviewed  POCT RAPID STREP A (OFFICE)    EKG   Radiology No results found.  Procedures Procedures (including critical care time)  Medications Ordered in UC Medications - No data to display  Initial Impression / Assessment and Plan / UC Course  I have reviewed the triage vital signs and the nursing notes.  Pertinent labs & imaging results that were available during my care of the patient were reviewed by me and considered in my medical decision making (see chart for details).     Viral upper respiratory tract infection with cough  Sore throat - Plan: POC rapid strep A, POC rapid strep A   Strep testing done today. This is negative. Symptoms and physical exam findings are consistent with viral upper respiratory infection with cough.  This does not require antibiotic treatment.  We focus treatment on improving the symptoms.  We will treat with the following:  Prednisone  40 mg (2 tablets) once daily for 5 days. Take this in the morning.  This is a steroid to help with inflammation and pain. Do not take ibuprofen  while you are taking this medication Benzonatate  (tessalon ) 100 mg every 8 hours as needed for cough.  May continue tylenol  for pain May continue hot teas and gargles.  Make sure to stay hydrated by drinking plenty of water. Return to urgent care or PCP if symptoms worsen or fail to resolve.      Final Clinical Impressions(s) / UC Diagnoses   Final diagnoses:  Sore  throat  Viral upper respiratory tract infection with cough     Discharge Instructions      Strep testing done today. This is negative. Symptoms and physical exam findings are consistent with viral upper respiratory infection with cough.  This does not require antibiotic treatment.  We focus treatment on improving the symptoms.  We will treat with the following:  Prednisone  40 mg (2 tablets) once daily for 5 days. Take this in the morning.  This is a steroid to help with inflammation and pain. Do not take ibuprofen  while you are taking this medication Benzonatate  (tessalon ) 100 mg every 8 hours as needed for cough.  May continue tylenol  for pain May continue hot teas and gargles.  Make sure to stay hydrated by drinking plenty of water. Return to urgent care or PCP if symptoms worsen or fail to resolve.         ED Prescriptions     Medication Sig Dispense Auth. Provider   predniSONE  (DELTASONE ) 20 MG tablet Take 2 tablets (40 mg total) by mouth daily with breakfast for 5 days. 10 tablet Teresa Norris A, PA-C   benzonatate  (TESSALON ) 100 MG capsule Take 1 capsule (100 mg total) by mouth every 8 (eight) hours. 21 capsule Teresa Norris LABOR, [place]      PDMP not reviewed this encounter.   Teresa Norris LABOR, [place] 09/21/24 1250
